# Patient Record
Sex: Male | Born: 1960 | Race: White | Hispanic: No | Marital: Married | Smoking: Never smoker
Health system: Southern US, Community
[De-identification: ages and names within clinical notes are randomized; demographics above are authoritative.]

## PROBLEM LIST (undated history)

## (undated) DIAGNOSIS — K219 Gastro-esophageal reflux disease without esophagitis: Secondary | ICD-10-CM

## (undated) DIAGNOSIS — I10 Essential (primary) hypertension: Secondary | ICD-10-CM

## (undated) DIAGNOSIS — T7840XA Allergy, unspecified, initial encounter: Secondary | ICD-10-CM

## (undated) DIAGNOSIS — F32A Depression, unspecified: Secondary | ICD-10-CM

## (undated) DIAGNOSIS — Z87442 Personal history of urinary calculi: Secondary | ICD-10-CM

## (undated) DIAGNOSIS — I639 Cerebral infarction, unspecified: Secondary | ICD-10-CM

## (undated) DIAGNOSIS — F329 Major depressive disorder, single episode, unspecified: Secondary | ICD-10-CM

## (undated) DIAGNOSIS — G4730 Sleep apnea, unspecified: Secondary | ICD-10-CM

## (undated) DIAGNOSIS — S53105A Unspecified dislocation of left ulnohumeral joint, initial encounter: Secondary | ICD-10-CM

## (undated) DIAGNOSIS — F419 Anxiety disorder, unspecified: Secondary | ICD-10-CM

## (undated) DIAGNOSIS — R112 Nausea with vomiting, unspecified: Secondary | ICD-10-CM

## (undated) DIAGNOSIS — Z9889 Other specified postprocedural states: Secondary | ICD-10-CM

## (undated) DIAGNOSIS — G459 Transient cerebral ischemic attack, unspecified: Secondary | ICD-10-CM

## (undated) HISTORY — DX: Sleep apnea, unspecified: G47.30

## (undated) HISTORY — DX: Essential (primary) hypertension: I10

## (undated) HISTORY — DX: Major depressive disorder, single episode, unspecified: F32.9

## (undated) HISTORY — DX: Allergy, unspecified, initial encounter: T78.40XA

---

## 2005-06-04 HISTORY — PX: LAPAROSCOPIC CHOLECYSTECTOMY: SUR755

## 2005-07-17 ENCOUNTER — Encounter: Admission: RE | Admit: 2005-07-17 | Discharge: 2005-07-17 | Payer: Self-pay | Admitting: Family Medicine

## 2005-07-18 ENCOUNTER — Encounter: Admission: RE | Admit: 2005-07-18 | Discharge: 2005-07-18 | Payer: Self-pay | Admitting: Family Medicine

## 2005-11-09 ENCOUNTER — Encounter (INDEPENDENT_AMBULATORY_CARE_PROVIDER_SITE_OTHER): Payer: Self-pay | Admitting: Specialist

## 2005-11-09 ENCOUNTER — Ambulatory Visit (HOSPITAL_COMMUNITY): Admission: RE | Admit: 2005-11-09 | Discharge: 2005-11-09 | Payer: Self-pay | Admitting: Surgery

## 2006-04-15 ENCOUNTER — Ambulatory Visit: Payer: Self-pay | Admitting: Family Medicine

## 2006-10-16 ENCOUNTER — Ambulatory Visit: Payer: Self-pay | Admitting: Family Medicine

## 2006-10-21 ENCOUNTER — Ambulatory Visit: Payer: Self-pay | Admitting: Family Medicine

## 2007-01-01 IMAGING — RF DG CHOLANGIOGRAM OPERATIVE
1 series · 4 of 4 positions shown · non-contrast
Comparison: none

CLINICAL DATA: Chronic cholecystitis.  Laparoscopic cholecystectomy.  
 INTRAOPERATIVE CHOLANGIOGRAM:
 The common bile duct is normal in caliber without retained calculi.  There is normal drainage into the duodenum and no extravasation.  Cystic duct remnant appears unremarkable.

[Series 1: run · 4 of 72 frames shown]
[frame 11/72]
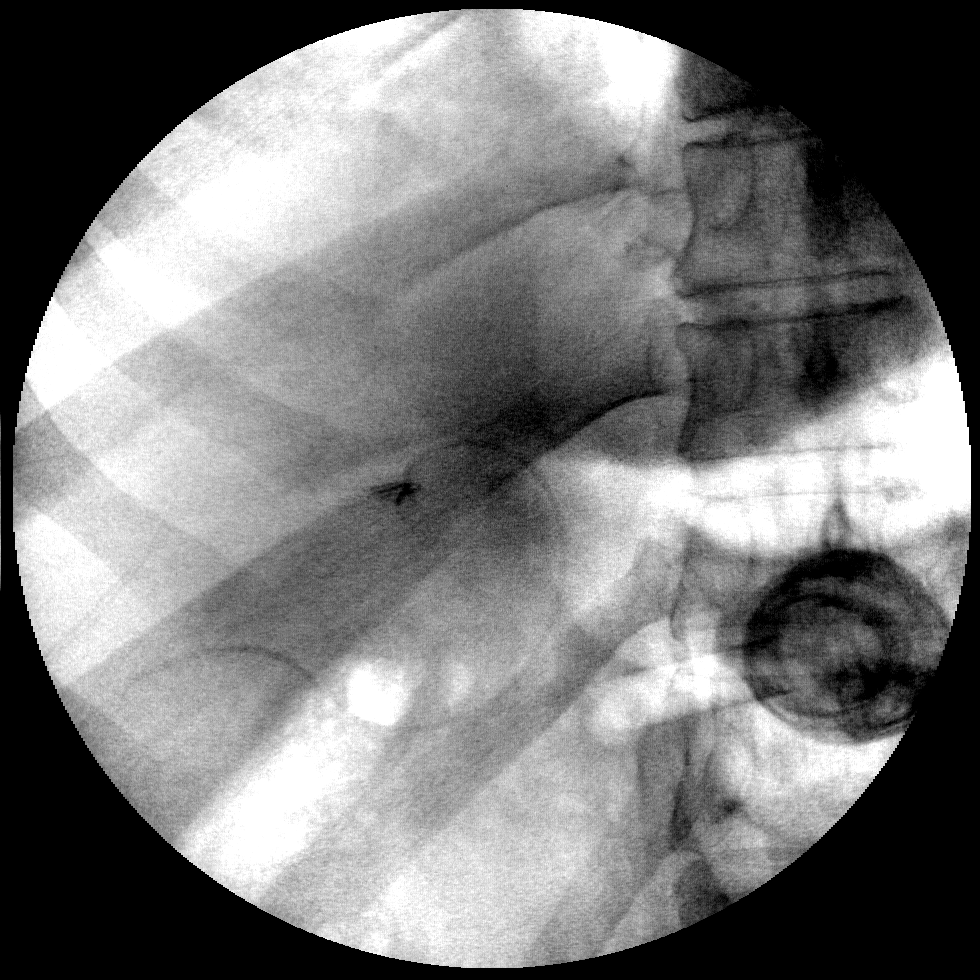
[frame 13/72]
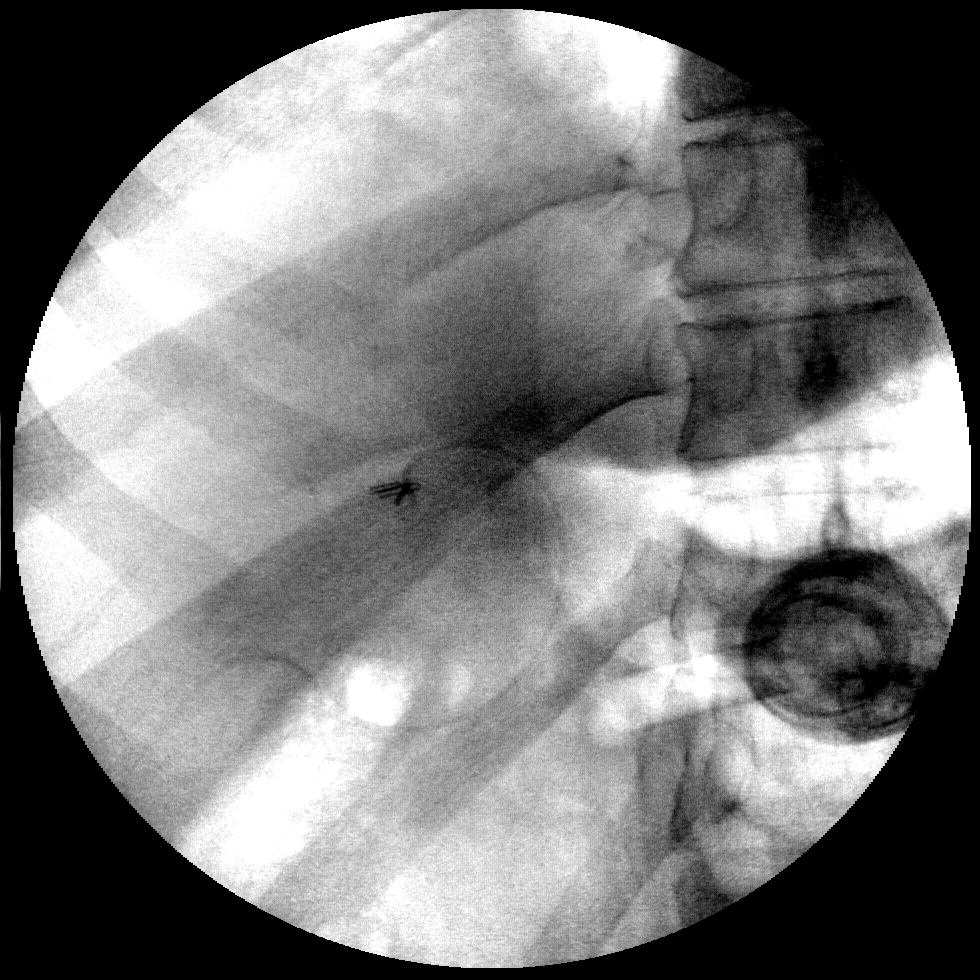
[frame 37/72]
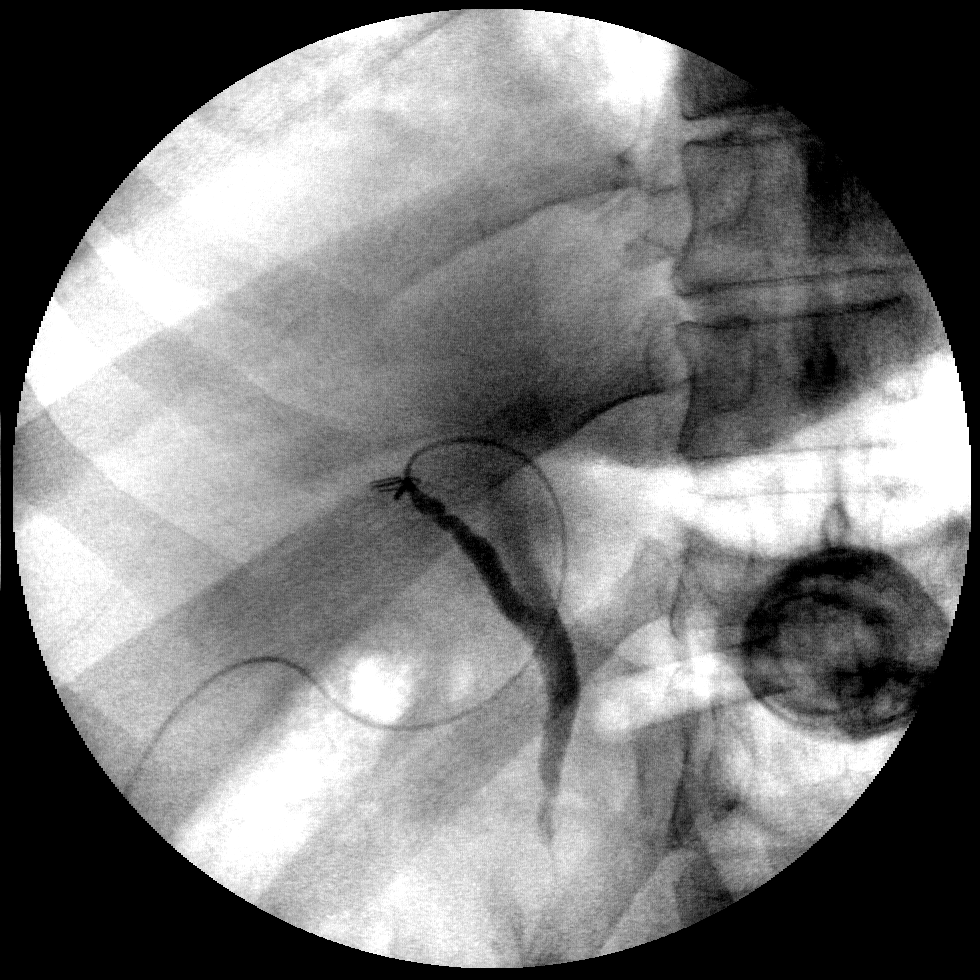
[frame 62/72]
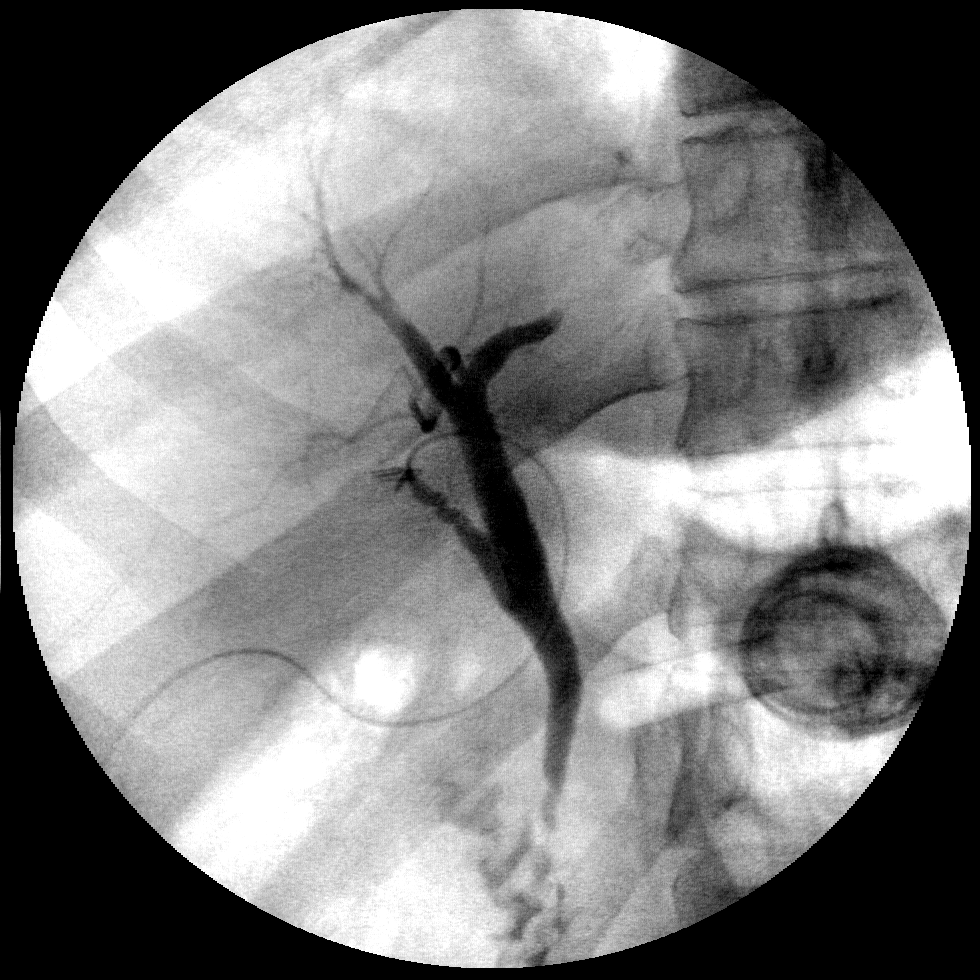

[4 of 4 positions shown; findings below may reference images not displayed]

IMPRESSION: Negative intraoperative cholangiogram.

## 2007-11-05 ENCOUNTER — Ambulatory Visit: Payer: Self-pay | Admitting: Family Medicine

## 2007-11-14 ENCOUNTER — Ambulatory Visit: Payer: Self-pay | Admitting: Family Medicine

## 2007-12-25 ENCOUNTER — Ambulatory Visit: Payer: Self-pay | Admitting: Family Medicine

## 2008-04-05 ENCOUNTER — Ambulatory Visit: Payer: Self-pay | Admitting: Family Medicine

## 2008-04-19 ENCOUNTER — Ambulatory Visit: Payer: Self-pay | Admitting: Family Medicine

## 2008-05-03 ENCOUNTER — Ambulatory Visit: Payer: Self-pay | Admitting: Family Medicine

## 2009-06-23 ENCOUNTER — Ambulatory Visit: Payer: Self-pay | Admitting: Family Medicine

## 2010-06-25 ENCOUNTER — Encounter: Payer: Self-pay | Admitting: Family Medicine

## 2010-10-09 ENCOUNTER — Ambulatory Visit (INDEPENDENT_AMBULATORY_CARE_PROVIDER_SITE_OTHER): Payer: BC Managed Care – PPO | Admitting: Family Medicine

## 2010-10-09 DIAGNOSIS — J0100 Acute maxillary sinusitis, unspecified: Secondary | ICD-10-CM

## 2010-10-20 NOTE — Op Note (Signed)
NAME:  Enck, Quasean                  ACCOUNT NO.:  [id number]   MEDICAL RECORD NO.:  [id number]          PATIENT TYPE:  AMB   LOCATION:  DAY                          FACILITY:  [facility]   PHYSICIAN:  Wilmon Arms. Corliss Skains, M.D. DATE OF BIRTH:  [birth year]   DATE OF PROCEDURE:  11/09/2005  DATE OF DISCHARGE:  11/10/2005                                 OPERATIVE REPORT   PREOPERATIVE DIAGNOSIS:  Chronic calculus cholecystitis.   POSTOPERATIVE DIAGNOSIS:  Chronic calculus cholecystitis.   PROCEDURE PERFORMED:  Laparoscopic cholecystectomy with intraoperative  cholangiogram.   SURGEON:  Wilmon Arms. Corliss Skains, M.D.   ASSISTANTSheppard Plumber. Earlene Plater, M.D.   ANESTHESIA:  General endotracheal.   INDICATIONS:  The patient is a 50 year old male who presents with a one-year  history of intermittent nausea and diarrhea.  He has also had several severe  episodes of abdominal pain associated with bloating, nausea, vomiting and  diarrhea.  An ultrasound performed showed multiple intraluminal gallstones  but no gallbladder wall thickening and normal bile duct size.  The patient  presents for cholecystectomy.   DESCRIPTION OF PROCEDURE:  The patient was brought to the operating room and  placed in supine position on the operating room table.  After an adequate  level of general endotracheal anesthesia was obtained, the patient's abdomen  was shaved, prepped with Betadine and draped in sterile fashion.  A time-out  was taken assure the proper patient, proper procedure.  A small amount of  local anesthetic was used to anesthetize the area just below his umbilicus.  Transverse incision was made, and dissection was carried down to the fascia.  Fascia was opened vertically.  The peritoneal cavity was bluntly entered.  A  stay suture of 0 Vicryl was placed in a pursestring fashion around the  fascial opening.  The Hasson cannula was inserted and secured with a stay  suture.  Pneumoperitoneum was obtained by  insufflating CO2, maintaining  maximal pressure of 15 mmHg.  The fascia was rotated in reverse  Trendelenburg position, rotated slightly to his left.  The scope was  inserted and the liver appeared normal.  The gallbladder had some flimsy  adhesions to it.  A 10-mm port was placed in subxiphoid position and two 5-  mm ports in the right upper quadrant.  The gallbladder was grasped with a  clamp and elevated over the edge of the liver.  The peritoneum was opened  over the hilum of the gallbladder, and the cystic duct was circumferentially  dissected.  It was ligated with a clip distally.  A small opening was  created on the cystic duct, and the [facility] cholangiogram catheter was inserted  through a stab incision and threaded into the cystic duct.  Initially, we  had some trouble threading the cholangiogram catheter, but eventually, we  were able to thread it around one of the spiral valves, and it was secured  with a clip.  The cholangiogram was obtained which showed a good flow  proximally and distally in the biliary tree with no filling defects.  There  was good flow  into the duodenum.  The catheter was then removed, and the  cystic duct was ligated with clips and divided.  The two branches of the  cystic artery were identified, and these were ligated with clips and  divided.  Cautery was then used to remove the gallbladder from the liver  bed.  The gallbladder was placed in an EndoCatch sac and removed through the  umbilical port.  The right upper quadrant was then reinspected for  hemostasis.  The irrigant was suctioned out.  The trocars were all removed.  The pursestring  suture was used to close the umbilical fascia.  Four-0 Monocryl was used to  close skin in a subcuticular fashion.  Steri-Strips and clean dressings were  applied.  The patient was then extubated and brought to recovery room in  stable condition.  All sponge and instrument counts were correct.      Wilmon Arms. Tsuei,  M.D.  Electronically Signed     MKT/MEDQ  D:  11/09/2005  T:  11/10/2005  Job:  [id number]   cc:   Sharlot Gowda, M.D.  Fax: [phone]

## 2010-10-23 ENCOUNTER — Encounter: Payer: Self-pay | Admitting: Family Medicine

## 2010-10-23 ENCOUNTER — Ambulatory Visit (INDEPENDENT_AMBULATORY_CARE_PROVIDER_SITE_OTHER): Payer: BC Managed Care – PPO | Admitting: Family Medicine

## 2010-10-23 VITALS — BP 140/90 | Temp 97.7°F | Wt 264.0 lb

## 2010-10-23 DIAGNOSIS — J019 Acute sinusitis, unspecified: Secondary | ICD-10-CM

## 2010-10-23 MED ORDER — AMOXICILLIN-POT CLAVULANATE 875-125 MG PO TABS: 1.0000 | ORAL_TABLET | Freq: Two times a day (BID) | ORAL | Status: AC

## 2010-10-23 NOTE — Patient Instructions (Signed)
Take all the Augmentin. If not entirely better please call for a refill or if  no improvement at all come back in for a visit. Use NyQuil at night for coughing

## 2010-10-23 NOTE — Progress Notes (Signed)
  Subjective:    Patient ID: Phillip Ortega, male    DOB: 1960/08/28, 50 y.o.   MRN: [id number]  HPI he was seen approximately 2 weeks ago and treated for sinus infection with amoxicillin. He states that he got almost 50% better area and he is now having difficulty with chest congestion and an intermittently productive cough as well as  congestion and slight sore throat but no fever or chills. He does have springtime allergies but usually it is over by this time. He does not smoke    Review of Systems     Objective:   Physical Exam TMs mucosa normal. Heart and lung exam normal. are clear. Throat is clear. Neck is supple without adenopathy.       Assessment & Plan:  Unresolved sinusitis. Obesity I discussed diet and exercise with him however he was reluctant to discuss this any further. Switch to Augmentin. Call if not entirely better.

## 2010-10-31 ENCOUNTER — Encounter: Payer: Self-pay | Admitting: Family Medicine

## 2010-10-31 ENCOUNTER — Ambulatory Visit (INDEPENDENT_AMBULATORY_CARE_PROVIDER_SITE_OTHER): Payer: BC Managed Care – PPO | Admitting: Family Medicine

## 2010-10-31 VITALS — BP 120/80 | Temp 97.8°F | Wt 260.0 lb

## 2010-10-31 DIAGNOSIS — J329 Chronic sinusitis, unspecified: Secondary | ICD-10-CM

## 2010-10-31 DIAGNOSIS — H6692 Otitis media, unspecified, left ear: Secondary | ICD-10-CM

## 2010-10-31 DIAGNOSIS — H6690 Otitis media, unspecified, unspecified ear: Secondary | ICD-10-CM

## 2010-10-31 MED ORDER — LEVOFLOXACIN 500 MG PO TABS: 500.0000 mg | ORAL_TABLET | Freq: Every day | ORAL | Status: AC

## 2010-10-31 NOTE — Progress Notes (Signed)
  Subjective:    Patient ID: Phillip Ortega, male    DOB: 03-01-1961, 50 y.o.   MRN: [id number]  HPI he is here for recheck. On his last visit he was treated for unresolved sinusitis. He continues to complain of nasal congestion and more a left-sided ear pain and pressure. No fever or chills but definite myalgias and malaise. He is been on Augmentin for one week and feels no improvement. He does not smoke but does have springtime allergy and they're usually gone by now.    Review of Systems     Objective:   Physical Exam alert and fatigued appearing. Left TM does show slight erythema and bulging, right is normal. Nasal mucosa is red and swollen with tenderness over frontal and maxillary sinuses. Throat is clear. Neck is supple without adenopathy. Heart and lungs normal.        Assessment & Plan:  Unresolved sinusitis with left otitis media I will switch him to Levaquin area discussed steroids with him and he would like to wait to see how with the medicine works before adding steroids

## 2010-10-31 NOTE — Patient Instructions (Signed)
Stay on the Levaquin on a daily basis. Use Tylenol or aspirin for aches and pains. Call me in 2 weeks and let me know how you do

## 2010-11-06 ENCOUNTER — Telehealth: Payer: Self-pay | Admitting: Family Medicine

## 2010-11-06 NOTE — Telephone Encounter (Signed)
I discussed his care. He is essentially no better. Give him another 5 days of Levaquin and a Sterapred double strength Dosepak for 12 days

## 2010-11-06 NOTE — Telephone Encounter (Signed)
Wife called husband not any better, throat sore, ear stopped up worse and is on 3rd round of antibiotic.  Please call Sue Lush [phone] or [phone].

## 2010-11-20 ENCOUNTER — Encounter: Payer: Self-pay | Admitting: Family Medicine

## 2010-11-20 ENCOUNTER — Ambulatory Visit (INDEPENDENT_AMBULATORY_CARE_PROVIDER_SITE_OTHER): Payer: BC Managed Care – PPO | Admitting: Family Medicine

## 2010-11-20 VITALS — BP 134/84 | HR 72 | Temp 98.5°F | Ht 73.0 in | Wt 262.0 lb

## 2010-11-20 DIAGNOSIS — J309 Allergic rhinitis, unspecified: Secondary | ICD-10-CM

## 2010-11-20 DIAGNOSIS — B370 Candidal stomatitis: Secondary | ICD-10-CM

## 2010-11-20 DIAGNOSIS — H9190 Unspecified hearing loss, unspecified ear: Secondary | ICD-10-CM

## 2010-11-20 MED ORDER — FLUTICASONE PROPIONATE 50 MCG/ACT NA SUSP: 2.0000 | Freq: Every day | NASAL | Status: DC

## 2010-11-20 MED ORDER — NYSTATIN 100000 UNIT/ML MT SUSP: 500000.0000 [IU] | Freq: Four times a day (QID) | OROMUCOSAL | Status: AC

## 2010-11-20 NOTE — Patient Instructions (Signed)
Restart Claritin and Mucinex

## 2010-11-20 NOTE — Progress Notes (Signed)
  Subjective:    Patient ID: Phillip Ortega, male    DOB: 21-May-1961, 50 y.o.   MRN: [id number]  HPI Patient presents with complaint of bilateral ear pain and sore throat.  He recently was treated for sinusitis and left otitis media  (initially with amoxacillin, then Augmentin, subsequently changed to Levaquin and 12 day prednisone pack).  Finished the prednisone 2 days ago.  Complains of ongoing stopped-up ears.  Has mainly been left ear, but has spread to right ear (plugging), and some pain in both ears (L worse than R) over the last 24 hours, and now tongue and throat hurts.  Feels feverish, but temps normal at home.  Not taking any OTC meds.  Had been taking ibuprofen and mucinex, but stopped these.   Review of Systems Denies nausea, vomiting, skin rashes.  Cough and congestion is much improved.  Slight chest congestion started today, some yellow phlegm (much less often than recently)   Objective:   Physical Exam BP 134/84  Pulse 72  Temp(Src) 98.5 F (36.9 C) (Oral)  Ht 6\' 1"  (1.854 m)  Wt 262 lb (118.842 kg)  BMI 34.57 kg/m2 Well developed male, in no distress.  Lethargic (was sleeping upon my arrival, but then became fully alert) HEENT:  PERRL, EOMI, conjunctiva clear.  TM's and EAC's without erythema or significant effusions.  Nasal mucosa mildly edematous, no erythema or purulence.  OP--tongue has white coating.  Upper palate has small areas of erythema, no ulcerations.  Posterior OP and uvula have some erythema and white patches.  Mucus membranes are moist.  Mild tenderness over both maxillary sinuses Neck:  No lymphadenopathy Heart:  Regular rate and rhythm without murmurs Lungs:  Clear bilaterally       Assessment & Plan:   1. Allergic rhinitis, cause unspecified  fluticasone (FLONASE) 50 MCG/ACT nasal spray   Restart Claritin, and start nasal steroid spray  2. Hearing loss  Ambulatory referral to ENT   suspect component of ETD, and h/o recent infection.  Symptoms not  improving with treatment--refer to ENT  3. Thrush, oral  nystatin (MYCOSTATIN) 100000 UNIT/ML suspension   Restart antihistamine (Claritin), Mucinex. Sinus rinses. Flonase--instructed on proper use Thrush--Nystatin  Refer to ENT for ongoing hearing/ear problems

## 2011-01-24 ENCOUNTER — Ambulatory Visit (HOSPITAL_COMMUNITY)
Admission: RE | Admit: 2011-01-24 | Discharge: 2011-01-24 | Disposition: A | Discharge status: Home / Self Care | Payer: BC Managed Care – PPO | Attending: Psychiatry | Admitting: Psychiatry

## 2011-01-24 DIAGNOSIS — F3289 Other specified depressive episodes: Secondary | ICD-10-CM | POA: Insufficient documentation

## 2011-01-24 DIAGNOSIS — F329 Major depressive disorder, single episode, unspecified: Secondary | ICD-10-CM | POA: Insufficient documentation

## 2011-02-15 ENCOUNTER — Ambulatory Visit (HOSPITAL_COMMUNITY): Payer: BC Managed Care – PPO | Admitting: Physician Assistant

## 2011-02-15 DIAGNOSIS — F341 Dysthymic disorder: Secondary | ICD-10-CM

## 2011-03-08 ENCOUNTER — Encounter (HOSPITAL_COMMUNITY): Payer: BC Managed Care – PPO | Admitting: Physician Assistant

## 2011-03-13 ENCOUNTER — Encounter (HOSPITAL_COMMUNITY): Payer: BC Managed Care – PPO | Admitting: Physician Assistant

## 2011-03-13 DIAGNOSIS — F341 Dysthymic disorder: Secondary | ICD-10-CM

## 2011-04-07 ENCOUNTER — Other Ambulatory Visit (HOSPITAL_COMMUNITY): Payer: Self-pay

## 2011-04-09 ENCOUNTER — Ambulatory Visit (HOSPITAL_COMMUNITY): Payer: BC Managed Care – PPO | Admitting: Physician Assistant

## 2011-04-09 ENCOUNTER — Encounter: Payer: Self-pay | Admitting: Family Medicine

## 2011-04-09 ENCOUNTER — Encounter (HOSPITAL_COMMUNITY): Payer: Self-pay | Admitting: Physician Assistant

## 2011-04-09 ENCOUNTER — Ambulatory Visit (INDEPENDENT_AMBULATORY_CARE_PROVIDER_SITE_OTHER): Payer: BC Managed Care – PPO | Admitting: Family Medicine

## 2011-04-09 VITALS — BP 150/98 | HR 78 | Wt 264.0 lb

## 2011-04-09 VITALS — BP 160/128 | HR 75 | Temp 97.9°F | Resp 16 | Ht 73.0 in | Wt 269.0 lb

## 2011-04-09 DIAGNOSIS — F3289 Other specified depressive episodes: Secondary | ICD-10-CM

## 2011-04-09 DIAGNOSIS — F411 Generalized anxiety disorder: Secondary | ICD-10-CM

## 2011-04-09 DIAGNOSIS — I10 Essential (primary) hypertension: Secondary | ICD-10-CM

## 2011-04-09 DIAGNOSIS — F329 Major depressive disorder, single episode, unspecified: Secondary | ICD-10-CM

## 2011-04-09 DIAGNOSIS — F419 Anxiety disorder, unspecified: Secondary | ICD-10-CM | POA: Insufficient documentation

## 2011-04-09 DIAGNOSIS — F341 Dysthymic disorder: Secondary | ICD-10-CM

## 2011-04-09 MED ORDER — BUSPIRONE HCL 15 MG PO TABS: ORAL_TABLET | ORAL | Status: DC

## 2011-04-09 MED ORDER — LISINOPRIL-HYDROCHLOROTHIAZIDE 10-12.5 MG PO TABS: 1.0000 | ORAL_TABLET | Freq: Every day | ORAL | Status: DC

## 2011-04-09 MED ORDER — BUPROPION HCL 100 MG PO TABS: 100.0000 mg | ORAL_TABLET | ORAL | Status: DC

## 2011-04-09 NOTE — Patient Instructions (Signed)
  Place anxiety patient instructions here. Place depression patient instructions here. Due to the new onset of primary hypertension he is referred to see his PCP Dr. Sharlot Gowda who will see him today at noon for evaluation. He will follow up with me in 1-2 weeks.

## 2011-04-09 NOTE — Progress Notes (Signed)
  Subjective:    Subjective:    Subjective:   Phillip Ortega is an 50 y.o. male who presents for evaluation and treatment of depressive symptoms.  Onset approximately 8 months ago, unchanged since that time.  Current symptoms include depressed mood, anhedonia, insomnia, psychomotor agitation, fatigue, feelings of worthlessness/guilt, difficulty concentrating, hopelessness, impaired memory, anxiety, loss of energy/fatigue and disturbed sleep.  Current treatment for depression:Individual therapy Sleep problems: Mild   Early awakening:Absent   Energy: Poor Motivation: Poor Concentration: Fair Rumination/worrying: Marked Memory: Fair Tearfulness: Absent  Anxiety: Marked  Panic: Absent  Overall Mood: Minimally improved  Hopelessness: Mild Suicidal ideation: Absent  Other/Psychosocial Stressors: Pt. Still is being frozen out of social contact with his all male co-workers. Family history positive for depression in the patient's unknown.  Previous treatment modalities employed include None.  Past episodes of depression:Previously in college. Organic causes of depression present: None.  Review of Systems Cardiovascular: negative   Objective:   Mental Status Examination: Posture and motor behavior: Appropriate Dress, grooming, personal hygiene: Appropriate Facial expression: Appropriate Speech: Appropriate Mood: Appropriate Coherency and relevance of thought: Appropriate Thought content: Appropriate Perceptions: Appropriate Orientation:Appropriate Attention and concentration: Appropriate Memory: : Appropriate Information: Not examined Vocabulary: Appropriate Abstract reasoning: Appropriate Judgment: Appropriate    Assessment:   Experiencing the following symptoms of depression most of the day nearly every day for more than two consecutive weeks: depressed mood  Depressive Disorder: with new onset of hypertension  Suicide Risk Assessment:  Suicidal intent: 0 Suicidal  plan: 0 Access to means for suicide: 0 Lethality of means for suicide: 0 Prior suicide attempts: 0 Recent exposure to suicide:0   Plan:    1. Depression  buPROPion (WELLBUTRIN) 100 MG tablet  2. Hypertension    3. Anxiety  busPIRone (BUSPAR) 15 MG tablet  4. Dysthymic disorder  busPIRone (BUSPAR) 15 MG tablet  5. Generalized anxiety disorder  busPIRone (BUSPAR) 15 MG tablet    Reviewed concept of depression as biochemical imbalance of neurotransmitters and rationale for treatment. Instructed patient to contact office or on-call physician promptly should condition worsen or any new symptoms appear and provided on-call telephone numbers.

## 2011-04-09 NOTE — Patient Instructions (Signed)
Work on [employer] and exercise. Take the medication and if you have difficulty with a cough, call me.

## 2011-04-09 NOTE — Progress Notes (Signed)
  Subjective:    Patient ID: Phillip Ortega, male    DOB: [birth year], 50 y.o.   MRN: [id number]  HPI He was sent here after visiting his psychologist. Apparently the blood pressures through her office were elevated over the last month. Review his record indicates he has had borderline blood pressure in the past.   Review of Systems     Objective:   Physical Exam Alert and in no distress. Blood pressure is recorded.       Assessment & Plan:  Hypertension I will place him on lisinopril with HCTZ. Discussed possible side effects. Also discussed lifestyle modification in terms of diet and exercise as well as avoiding caffeine and decongestants. Recheck here one month.

## 2011-04-12 ENCOUNTER — Encounter (HOSPITAL_COMMUNITY): Payer: BC Managed Care – PPO | Admitting: Physician Assistant

## 2011-04-23 ENCOUNTER — Other Ambulatory Visit (HOSPITAL_COMMUNITY): Payer: Self-pay

## 2011-04-24 ENCOUNTER — Encounter (HOSPITAL_COMMUNITY): Payer: Self-pay | Admitting: Physician Assistant

## 2011-04-24 ENCOUNTER — Ambulatory Visit (HOSPITAL_COMMUNITY): Payer: BC Managed Care – PPO | Admitting: Physician Assistant

## 2011-04-24 DIAGNOSIS — F329 Major depressive disorder, single episode, unspecified: Secondary | ICD-10-CM

## 2011-04-24 DIAGNOSIS — F411 Generalized anxiety disorder: Secondary | ICD-10-CM

## 2011-04-24 DIAGNOSIS — F341 Dysthymic disorder: Secondary | ICD-10-CM

## 2011-04-24 DIAGNOSIS — F419 Anxiety disorder, unspecified: Secondary | ICD-10-CM

## 2011-04-24 MED ORDER — BUPROPION HCL 100 MG PO TABS: 100.0000 mg | ORAL_TABLET | ORAL | Status: DC

## 2011-04-24 MED ORDER — FLUOXETINE HCL 20 MG PO TABS: 20.0000 mg | ORAL_TABLET | Freq: Every day | ORAL | Status: DC

## 2011-04-24 MED ORDER — BUSPIRONE HCL 15 MG PO TABS: ORAL_TABLET | ORAL | Status: DC

## 2011-04-24 NOTE — Patient Instructions (Signed)
Start Weight Watchers, continue low salt, low fat diet. Continue out patient therapy. Follow up 6 weeks.

## 2011-04-24 NOTE — Progress Notes (Signed)
  [facility] [address] Progress Note  Phillip Ortega [id number] 50 y.o.  04/24/2011 2:39 PM  Chief Complaint: Med Management  History of Present Illness: Patient was seen recently in this office and was noted to have elevated blood pressure.  He was sent to his PCP for evaluation and started on an ACEI Zestoretic.  He still notes having some headaches every other day or so, for which he takes Ibuprofen. Suicidal Ideation: No Plan Formed: No Patient has means to carry out plan: No  Homicidal Ideation: No Plan Formed: No Patient has means to carry out plan: No  Review of Systems: Psychiatric: Agitation: Yes Hallucination: No Depressed Mood: Yes Insomnia: No Hypersomnia: No Altered Concentration: Yes Feels Worthless: Yes Grandiose Ideas: No Belief In Special Powers: No New/Increased Substance Abuse: No Compulsions: No  Neurologic: Headache: No Seizure: No Paresthesias: No    Outpatient Encounter Prescriptions as of 04/24/2011  Medication Sig Dispense Refill  . buPROPion (WELLBUTRIN) 100 MG tablet Take 1 tablet (100 mg total) by mouth 1 day or 1 dose.  30 tablet  1  . busPIRone (BUSPAR) 15 MG tablet 1/2 tablet po BID  30 tablet  1  . FLUoxetine (PROZAC) 20 MG tablet Take 20 mg by mouth daily.        Marland Kitchen lisinopril-hydrochlorothiazide (ZESTORETIC) 10-12.5 MG per tablet Take 1 tablet by mouth daily.  30 tablet  11  . fluticasone (FLONASE) 50 MCG/ACT nasal spray Place 2 sprays into the nose daily.  16 g  5  . loratadine (CLARITIN) 10 MG tablet Take 10 mg by mouth daily.          Past Psychiatric History/Hospitalization(s): Anxiety: Yes Bipolar Disorder: No Depression: Yes Mania: No Psychosis: No Schizophrenia: No Personality Disorder: No Hospitalization for psychiatric illness: No History of Electroconvulsive Shock Therapy: No Prior Suicide Attempts: No  Physical Exam: Constitutional:  BP 110/77  Pulse 84  General Appearance: alert, oriented, no acute  distress  Musculoskeletal: Strength & Muscle Tone: within normal limits Gait & Station: normal Patient leans: N/A  Psychiatric: Speech (describe rate, volume, coherence, spontaneity, and abnormalities if any): rapid due to anxiety   Thought Process (describe rate, content, abstract reasoning, and computation):   Associations: Coherent  Thoughts: normal  Mental Status: Orientation: oriented to person, place, time/date and situation Mood & Affect: normal affect, anxiety and agitation Attention Span & Concentration: decreased  Medical Decision Making (Choose Three): Established Problem, Stable/Improving (1) Semaj was quickly assessed for symptoms of ADD as he noted several symptoms.  He does have 10 out of 16 symptoms. Assessment: Axis I: GAD            ADD  Axis II: N/A  Axis III: Hypertension, obesity  Axis IV: work stressors, social stressors  Axis V: GAF:60   Plan: Continue medication to see if he gets some resolution with the Welbutrin. I have also suggested that he start Weight Watchers to help with his weight loss. Continue out patient therapy. Follow up 6 weeks.  Braxton Weisbecker, PA 04/24/2011

## 2011-05-08 ENCOUNTER — Encounter: Payer: Self-pay | Admitting: Internal Medicine

## 2011-05-10 ENCOUNTER — Ambulatory Visit (INDEPENDENT_AMBULATORY_CARE_PROVIDER_SITE_OTHER): Payer: BC Managed Care – PPO | Admitting: Family Medicine

## 2011-05-10 ENCOUNTER — Encounter: Payer: Self-pay | Admitting: Family Medicine

## 2011-05-10 VITALS — BP 120/78 | HR 80 | Wt 264.0 lb

## 2011-05-10 DIAGNOSIS — I10 Essential (primary) hypertension: Secondary | ICD-10-CM

## 2011-05-10 NOTE — Progress Notes (Signed)
  Subjective:    Patient ID: Phillip Ortega, male    DOB: Nov 15, 1960, 50 y.o.   MRN: [id number]  HPI He is here for a blood pressure recheck. He is doing quite well. He does have some occasional aches and pains especially when he sits for House periods of time. He continues on his psychotropic medications and in counseling. He has had a flu shot.   Review of Systems     Objective:   Physical Exam Alert and in no distress with appropriate affect       Assessment & Plan:  Hypertension now adequately controlled. Continue on present medication regimen. Also encouraged him to be even more physically active on regular basis.

## 2011-05-10 NOTE — Patient Instructions (Signed)
Continue on your present medications. 

## 2011-06-05 DIAGNOSIS — G4730 Sleep apnea, unspecified: Secondary | ICD-10-CM

## 2011-06-05 DIAGNOSIS — F329 Major depressive disorder, single episode, unspecified: Secondary | ICD-10-CM

## 2011-06-05 HISTORY — DX: Sleep apnea, unspecified: G47.30

## 2011-06-05 HISTORY — DX: Major depressive disorder, single episode, unspecified: F32.9

## 2011-06-07 ENCOUNTER — Ambulatory Visit (HOSPITAL_COMMUNITY): Payer: BC Managed Care – PPO | Admitting: Physician Assistant

## 2011-07-02 ENCOUNTER — Encounter (HOSPITAL_COMMUNITY): Payer: Self-pay | Admitting: Psychiatry

## 2011-07-02 ENCOUNTER — Ambulatory Visit (INDEPENDENT_AMBULATORY_CARE_PROVIDER_SITE_OTHER): Payer: BC Managed Care – PPO | Admitting: Psychiatry

## 2011-07-02 DIAGNOSIS — F419 Anxiety disorder, unspecified: Secondary | ICD-10-CM

## 2011-07-02 DIAGNOSIS — F341 Dysthymic disorder: Secondary | ICD-10-CM

## 2011-07-02 DIAGNOSIS — F411 Generalized anxiety disorder: Secondary | ICD-10-CM

## 2011-07-02 MED ORDER — BUSPIRONE HCL 15 MG PO TABS: ORAL_TABLET | ORAL | Status: DC

## 2011-07-02 MED ORDER — FLUOXETINE HCL 20 MG PO TABS: 20.0000 mg | ORAL_TABLET | Freq: Every day | ORAL | Status: DC

## 2011-07-02 NOTE — Progress Notes (Signed)
[facility] [address] Progress Note  Phillip Ortega [id number] 51 y.o.  07/02/2011 3:58 PM  Chief Complaint:  I'm here to get my medication   History of Present Illness: Patient is 51 year old married Caucasian male who is seen in this office for med management. Patient is seen by this Clinical research associate first time. He was seen earlier by physician assistant in this office. Patient endorse history of anxiety depression and last year he has having some suicidal thoughts without any plan. He's been taking her antidepressant and 1 and anxiety medication. On last visit Wellbutrin was added to help his anxiety however he has noticed increase in his blood pressure since the Wellbutrin was added. He has not seen much improvement in his anxiety but reported that Prozac helps the depression. He wants to continue Prozac and BuSpar. His blood pressure is controlled with antihypertensive medication. He denies any shakes or headache recently.  Past psychiatric history Denies any history of inpatient psychiatric treatment or any previous suicidal attempt.  Social history Patient works in [employer]. He is married and has one daughter.  Suicidal Ideation: No Plan Formed: No Patient has means to carry out plan: No  Homicidal Ideation: No Plan Formed: No Patient has means to carry out plan: No  Review of Systems: Psychiatric: Agitation: Yes Hallucination: No Depressed Mood: Yes Insomnia: No Hypersomnia: No Altered Concentration: Yes Feels Worthless: Yes Grandiose Ideas: No Belief In Special Powers: No New/Increased Substance Abuse: No Compulsions: No  Neurologic: Headache: No Seizure: No Paresthesias: No    Outpatient Encounter Prescriptions as of 07/02/2011  Medication Sig Dispense Refill  . FLUoxetine (PROZAC) 20 MG tablet Take 1 tablet (20 mg total) by mouth daily.  30 tablet  0  . fluticasone (FLONASE) 50 MCG/ACT nasal spray Place 2 sprays into the nose daily.  16 g  5  .  DISCONTD: FLUoxetine (PROZAC) 20 MG tablet Take 1 tablet (20 mg total) by mouth daily.  30 tablet  1  . buPROPion (WELLBUTRIN) 100 MG tablet Take 1 tablet (100 mg total) by mouth 1 day or 1 dose.  30 tablet  1  . busPIRone (BUSPAR) 15 MG tablet 1/2 tablet po BID  30 tablet  0  . lisinopril-hydrochlorothiazide (ZESTORETIC) 10-12.5 MG per tablet Take 1 tablet by mouth daily.  30 tablet  11  . loratadine (CLARITIN) 10 MG tablet Take 10 mg by mouth daily.        Marland Kitchen DISCONTD: busPIRone (BUSPAR) 15 MG tablet 1/2 tablet po BID  30 tablet  1    Past Psychiatric History/Hospitalization(s): Anxiety: Yes Bipolar Disorder: No Depression: Yes Mania: No Psychosis: No Schizophrenia: No Personality Disorder: No Hospitalization for psychiatric illness: No History of Electroconvulsive Shock Therapy: No Prior Suicide Attempts: No  Physical Exam: Constitutional:  Wt 265 lb (120.203 kg)  General Appearance: alert, oriented, no acute distress  Musculoskeletal: Strength & Muscle Tone: within normal limits Gait & Station: normal Patient leans: N/A  Mental status examination Patient is casually dressed and fairly groomed. He appears very anxious and restless. His speech is very fast and rapid but clear and coherent. His thought process is also fast but logical and goal-directed. Denies any active or passive suicidal thoughts or homicidal thoughts. His attention and concentration is distracted at times. There no psychotic symptoms present. He's alert and oriented x3. There no delusion or paranoid thinking present. His insight judgment and impulse control is okay  Medical Decision Making (Choose Three): Established Problem, Stable/Improving (1) Herman was quickly  assessed for symptoms of ADD as he noted several symptoms.  He does have 10 out of 16 symptoms.  Assessment: Axis I: GAD            ADD  Axis II: N/A  Axis III: Hypertension, obesity  Axis IV: work stressors, social stressors  Axis V:  GAF:60   Plan: Continue  I do believe patient does not need to antidepressant especially if Wellbutrin is causing hypertensive episodes and headache. I will recommend to stop Wellbutrin and continue Prozac and BuSpar. I explained risks and benefits of medication in detail. Patient has lost some weight from the past and he is more involved in healthy diet and exercise. I recommended to call us if he has any question or concern about the medication or if he feel worsening of his depression and anxiety symptoms. I will see him again in 3 weeks. Time spent 30 minutes  Joshus Rogan T., MD 07/02/2011

## 2011-07-30 ENCOUNTER — Ambulatory Visit (HOSPITAL_COMMUNITY): Payer: BC Managed Care – PPO | Admitting: Psychiatry

## 2011-09-01 ENCOUNTER — Other Ambulatory Visit (HOSPITAL_COMMUNITY): Payer: Self-pay | Admitting: Psychiatry

## 2011-09-03 ENCOUNTER — Other Ambulatory Visit (HOSPITAL_COMMUNITY): Payer: Self-pay | Admitting: Psychiatry

## 2011-09-03 NOTE — Telephone Encounter (Signed)
Must see physician for future refills

## 2011-09-20 ENCOUNTER — Telehealth: Payer: Self-pay | Admitting: Internal Medicine

## 2011-09-20 MED ORDER — LISINOPRIL-HYDROCHLOROTHIAZIDE 10-12.5 MG PO TABS: 1.0000 | ORAL_TABLET | Freq: Every day | ORAL | Status: DC

## 2011-09-20 NOTE — Telephone Encounter (Signed)
Sent med in 

## 2011-10-29 ENCOUNTER — Other Ambulatory Visit (HOSPITAL_COMMUNITY): Payer: Self-pay | Admitting: Psychiatry

## 2011-10-30 ENCOUNTER — Other Ambulatory Visit (HOSPITAL_COMMUNITY): Payer: Self-pay | Admitting: Psychiatry

## 2012-07-31 ENCOUNTER — Encounter: Payer: Self-pay | Admitting: Family Medicine

## 2012-07-31 ENCOUNTER — Ambulatory Visit (INDEPENDENT_AMBULATORY_CARE_PROVIDER_SITE_OTHER): Payer: BC Managed Care – PPO | Admitting: Family Medicine

## 2012-07-31 VITALS — BP 140/90 | HR 78 | Temp 97.9°F | Wt 259.0 lb

## 2012-07-31 DIAGNOSIS — J302 Other seasonal allergic rhinitis: Secondary | ICD-10-CM | POA: Insufficient documentation

## 2012-07-31 DIAGNOSIS — J309 Allergic rhinitis, unspecified: Secondary | ICD-10-CM

## 2012-07-31 DIAGNOSIS — J019 Acute sinusitis, unspecified: Secondary | ICD-10-CM

## 2012-07-31 MED ORDER — AMOXICILLIN 875 MG PO TABS: 875.0000 mg | ORAL_TABLET | Freq: Two times a day (BID) | ORAL | Status: DC

## 2012-07-31 NOTE — Progress Notes (Signed)
  Subjective:    Patient ID: Phillip Ortega, male    DOB: [birth year], 52 y.o.   MRN: [id number]  HPI He has a one-week history of difficulty with sore throat, myalgias, dry cough is now productive. He then developed nasal congestion, headache, PND, sneezing, itchy watery eyes. No fever or chills but now he is having intermittent bilateral earache. He has tried OTC meds for relief. He does not smokeand does have seasonal allergies for which he takes Claritin.   Review of Systems     Objective:   Physical Exam alert and in no distress. Tympanic membranes and canals are normal. Throat is clear. Tonsils are normal. Neck is supple without adenopathy or thyromegaly. Cardiac exam shows a regular sinus rhythm without murmurs or gallops. Lungs are clear to auscultation. Nasal mucosa is red with tenderness over frontal sinuses        Assessment & Plan:  Acute sinusitis - Plan: amoxicillin (AMOXIL) 875 MG tablet  Allergic rhinitis, seasonal he is to call if not entirely better he finishes the antibiotic.

## 2012-07-31 NOTE — Patient Instructions (Signed)

## 2012-10-13 ENCOUNTER — Ambulatory Visit (INDEPENDENT_AMBULATORY_CARE_PROVIDER_SITE_OTHER): Payer: BC Managed Care – PPO | Admitting: Medical

## 2012-10-13 ENCOUNTER — Encounter: Payer: Self-pay | Admitting: Medical

## 2012-10-13 VITALS — BP 130/90 | HR 80 | Temp 98.1°F | Resp 16 | Wt 258.0 lb

## 2012-10-13 DIAGNOSIS — J309 Allergic rhinitis, unspecified: Secondary | ICD-10-CM

## 2012-10-13 MED ORDER — CETIRIZINE HCL 10 MG PO TABS: 10.0000 mg | ORAL_TABLET | Freq: Every day | ORAL | Status: DC

## 2012-10-13 MED ORDER — MOMETASONE FUROATE 50 MCG/ACT NA SUSP: 2.0000 | Freq: Every day | NASAL | Status: DC

## 2012-10-13 NOTE — Progress Notes (Signed)
Subjective:  Phillip Ortega is a 52 y.o. male who presents for possible sinus infection.    Here for 3 day hx/o sneezing, 2 day hx/o nasal congestion, sinus pressure, runny nose, post nasal drip, sore throat, dry cough, and body aches.  occasional itchy eyes.  Temp 99.2 yesterday. otherwise no fever, chills, SOB, NVD.  Past history is significant for seasonal allergies, using claritin daily. Using OTC cold medication and antihistamine.  No prior prescription allergy medication.   He has had sinus infections in the past, had one in January. Patient is a non-smoker.  Denies sick contacts.  No other aggravating or relieving factors.  No other c/o.  Past Medical History  Diagnosis Date  . Hypertension   . Allergy     rhinitis   ROS as in subjective  Objective: Filed Vitals:   10/13/12 1143  BP: 130/90  Pulse: 80  Temp: 98.1 F (36.7 C)  Resp: 16    General appearance: Alert, WD/WN, no distress                             Skin: warm, no rash                           Head: +mild frontal sinus tenderness,                            Eyes: conjunctiva normal, corneas clear, PERRLA                            Ears: pearly TMs, external ear canals normal                          Nose: swollen pink turbinates,with clear discharge             Mouth/throat: MMM, tongue normal, mild pharyngeal erythema                           Neck: supple, no adenopathy, no thyromegaly, nontender                          Heart: RRR, normal S1, S2, no murmurs                         Lungs: CTA bilaterally, no wheezes, rales, or rhonchi      Assessment and Plan:   Encounter Diagnosis  Name Primary?  . Allergic rhinitis Yes   Your current symptoms suggest congestion related to all the pollen  Recommendations:  Increase water intake  consider Zyrtec or Allegra OTC as a better antihistamine (sneezing, runny nose, congestion)  consider nasal saline - spray or flush, consider this twice daily  Follow this  up with salt water gargles to clear out mucous from the back of the throat  Begin Nasonex, 1 spray per nostril twice daily for 1-2 weeks.  After 1-2 weeks, if this is helping a lot, just go to once daily  F/u prn

## 2012-10-13 NOTE — Patient Instructions (Addendum)
Your current symptoms suggest congestion related to all the pollen  Recomedatinos:  Increase water intake  consdier Zyrtec or Allegra OTC as a better anthisihsamine (sneezing, runny nose, congestion)  coinsdier nasal saline - spray or flush, consider this twice daily  Follow this up with salt water gargles to clear out mucous from the back of the throat  Begin Nasonex, 1 spray per nostril twice daily for 1-2 weeks.  After 1-2 weeks, if this is helping a lot, just go to once daily (sniff sniff)  Allergic Rhinitis Allergic rhinitis is when the mucous membranes in the nose respond to allergens. Allergens are particles in the air that cause your body to have an allergic reaction. This causes you to release allergic antibodies. Through a chain of events, these eventually cause you to release histamine into the blood stream (hence the use of antihistamines). Although meant to be protective to the body, it is this release that causes your discomfort, such as frequent sneezing, congestion and an itchy runny nose.  CAUSES  The pollen allergens may come from grasses, trees, and weeds. This is seasonal allergic rhinitis, or "hay fever." Other allergens cause year-round allergic rhinitis (perennial allergic rhinitis) such as house dust mite allergen, pet dander and mold spores.  SYMPTOMS   Nasal stuffiness (congestion).  Runny, itchy nose with sneezing and tearing of the eyes.  There is often an itching of the mouth, eyes and ears. It cannot be cured, but it can be controlled with medications. DIAGNOSIS  If you are unable to determine the offending allergen, skin or blood testing may find it. TREATMENT   Avoid the allergen.  Medications and allergy shots (immunotherapy) can help.  Hay fever may often be treated with antihistamines in pill or nasal spray forms. Antihistamines block the effects of histamine. There are over-the-counter medicines that may help with nasal congestion and swelling  around the eyes. Check with your caregiver before taking or giving this medicine. If the treatment above does not work, there are many new medications your caregiver can prescribe. Stronger medications may be used if initial measures are ineffective. Desensitizing injections can be used if medications and avoidance fails. Desensitization is when a patient is given ongoing shots until the body becomes less sensitive to the allergen. Make sure you follow up with your caregiver if problems continue. SEEK MEDICAL CARE IF:   You develop fever (more than 100.5 F (38.1 C).  You develop a cough that does not stop easily (persistent).  You have shortness of breath.  You start wheezing.  Symptoms interfere with normal daily activities. Document Released: 02/13/2001 Document Revised: 08/13/2011 Document Reviewed: 08/25/2008 [facility] Patient Information 2013 [place], [place].

## 2014-06-28 ENCOUNTER — Ambulatory Visit (INDEPENDENT_AMBULATORY_CARE_PROVIDER_SITE_OTHER): Payer: BLUE CROSS/BLUE SHIELD | Admitting: Family Medicine

## 2014-06-28 VITALS — BP 146/100 | HR 84 | Temp 98.4°F | Wt 273.0 lb

## 2014-06-28 DIAGNOSIS — R030 Elevated blood-pressure reading, without diagnosis of hypertension: Secondary | ICD-10-CM

## 2014-06-28 DIAGNOSIS — E669 Obesity, unspecified: Secondary | ICD-10-CM

## 2014-06-28 DIAGNOSIS — J019 Acute sinusitis, unspecified: Secondary | ICD-10-CM

## 2014-06-28 DIAGNOSIS — IMO0001 Reserved for inherently not codable concepts without codable children: Secondary | ICD-10-CM

## 2014-06-28 MED ORDER — AMOXICILLIN 875 MG PO TABS: 875.0000 mg | ORAL_TABLET | Freq: Two times a day (BID) | ORAL | Status: DC

## 2014-06-28 NOTE — Progress Notes (Signed)
   Subjective:    Patient ID: Phillip Ortega, male    DOB: 1961-03-26, 54 y.o.   MRN: [id number]  HPI He is here for evaluation of a one-week history of generalized aching as well as sinus pain and nasal congestion, dry cough, chills. No PND, sore throat, earache, to discomfort. He does not smoke.   Review of Systems     Objective:   Physical Exam alert and in no distress. Tympanic membranes and canals are normal. Nasal mucosa is normal. Tender over frontal and maxillary sinuses. Throat is clear. Tonsils are normal. Neck is supple without adenopathy or thyromegaly. Cardiac exam shows a regular sinus rhythm without murmurs or gallops. Lungs are clear to auscultation.        Assessment & Plan:  Acute sinusitis, recurrence not specified, unspecified location - Plan: amoxicillin (AMOXIL) 875 MG tablet  Obesity  Elevated blood pressure  he is to call me if not entirely better and he finishes the antibiotic. Also discussed making last I'll changes in regard to diet and exercise. Recommended 15-20 minutes of something physical every day. Discussed breaking it up into 35 minute increments while at work. Also discussed cutting back on carbohydrates. He is to return here in several months for recheck on his blood pressure

## 2014-06-28 NOTE — Patient Instructions (Addendum)
Get 15-20 minutes of some kind of physical activity everyday. It can be 5 minutes here and there . Cut back on white food. Bread, rice, pasta, potatoes, sugar If you are not totally back to normal when you finish the antibiotic, call me.

## 2014-06-30 ENCOUNTER — Telehealth: Payer: Self-pay | Admitting: Family Medicine

## 2014-06-30 MED ORDER — BENZONATATE 200 MG PO CAPS: 200.0000 mg | ORAL_CAPSULE | Freq: Three times a day (TID) | ORAL | Status: DC | PRN

## 2014-06-30 NOTE — Telephone Encounter (Signed)
Pt called and stated that he is not any better. States he is now running a fever and developed a cough. Pt would like something for cough. [facility] uses [facility] [address] and can be reached at [phone].

## 2014-07-02 ENCOUNTER — Emergency Department (HOSPITAL_COMMUNITY): Payer: BLUE CROSS/BLUE SHIELD

## 2014-07-02 ENCOUNTER — Ambulatory Visit (INDEPENDENT_AMBULATORY_CARE_PROVIDER_SITE_OTHER): Payer: BLUE CROSS/BLUE SHIELD | Admitting: Medical

## 2014-07-02 ENCOUNTER — Emergency Department (HOSPITAL_COMMUNITY)
Admission: EM | Admit: 2014-07-02 | Discharge: 2014-07-02 | Disposition: A | Discharge status: Home / Self Care | Payer: BLUE CROSS/BLUE SHIELD | Attending: Emergency Medicine | Admitting: Emergency Medicine

## 2014-07-02 ENCOUNTER — Ambulatory Visit
Admission: RE | Admit: 2014-07-02 | Discharge: 2014-07-02 | Disposition: A | Discharge status: Home / Self Care | Payer: Self-pay | Source: Ambulatory Visit | Attending: Medical | Admitting: Medical

## 2014-07-02 ENCOUNTER — Telehealth: Payer: Self-pay | Admitting: Medical

## 2014-07-02 ENCOUNTER — Encounter (HOSPITAL_COMMUNITY): Payer: Self-pay | Admitting: Physical Medicine and Rehabilitation

## 2014-07-02 ENCOUNTER — Other Ambulatory Visit: Payer: Self-pay | Admitting: Medical

## 2014-07-02 ENCOUNTER — Encounter: Payer: Self-pay | Admitting: Medical

## 2014-07-02 VITALS — BP 92/60 | HR 86 | Temp 98.0°F | Resp 16 | Wt 273.0 lb

## 2014-07-02 DIAGNOSIS — I10 Essential (primary) hypertension: Secondary | ICD-10-CM | POA: Diagnosis not present

## 2014-07-02 DIAGNOSIS — R059 Cough, unspecified: Secondary | ICD-10-CM

## 2014-07-02 DIAGNOSIS — R062 Wheezing: Secondary | ICD-10-CM

## 2014-07-02 DIAGNOSIS — R918 Other nonspecific abnormal finding of lung field: Secondary | ICD-10-CM

## 2014-07-02 DIAGNOSIS — R509 Fever, unspecified: Secondary | ICD-10-CM

## 2014-07-02 DIAGNOSIS — J209 Acute bronchitis, unspecified: Secondary | ICD-10-CM | POA: Diagnosis not present

## 2014-07-02 DIAGNOSIS — R05 Cough: Secondary | ICD-10-CM | POA: Diagnosis present

## 2014-07-02 DIAGNOSIS — Z7951 Long term (current) use of inhaled steroids: Secondary | ICD-10-CM | POA: Insufficient documentation

## 2014-07-02 DIAGNOSIS — Z792 Long term (current) use of antibiotics: Secondary | ICD-10-CM | POA: Diagnosis not present

## 2014-07-02 DIAGNOSIS — I951 Orthostatic hypotension: Secondary | ICD-10-CM

## 2014-07-02 DIAGNOSIS — Z79899 Other long term (current) drug therapy: Secondary | ICD-10-CM | POA: Diagnosis not present

## 2014-07-02 LAB — CBC WITH DIFFERENTIAL/PLATELET
BASOS ABS: 0 10*3/uL (ref 0.0–0.1)
Eosinophils Absolute: 0 10*3/uL (ref 0.0–0.7)
HCT: 45 % (ref 39.0–52.0)
HEMOGLOBIN: 14.1 g/dL (ref 13.0–17.0)
LYMPHS ABS: 1.7 10*3/uL (ref 0.7–4.0)
Lymphocytes Relative: 19 % (ref 12–46)
MCH: 27.2 pg (ref 26.0–34.0)
MCHC: 31.2 g/dL (ref 30.0–36.0)
MCV: 87 fL (ref 78.0–100.0)
MONO ABS: 0.4 10*3/uL (ref 0.1–1.0)
Monocytes Relative: 4 % (ref 3–12)
Neutro Abs: 6.9 10*3/uL (ref 1.7–7.7)
Neutrophils Relative %: 77 % (ref 43–77)
Platelets: 201 10*3/uL (ref 150–400)
RBC: 5.18 MIL/uL (ref 4.22–5.81)
RDW: 13.8 % (ref 11.5–15.5)
WBC: 9 10*3/uL (ref 4.0–10.5)

## 2014-07-02 LAB — BASIC METABOLIC PANEL
BUN: 16 mg/dL (ref 6–23)
CHLORIDE: 100 meq/L (ref 96–112)
CO2: 28 meq/L (ref 19–32)
Calcium: 8.9 mg/dL (ref 8.4–10.5)
Creat: 1.2 mg/dL (ref 0.50–1.35)
GLUCOSE: 118 mg/dL — AB (ref 70–99)
POTASSIUM: 4.6 meq/L (ref 3.5–5.3)
Sodium: 139 mEq/L (ref 135–145)

## 2014-07-02 LAB — I-STAT CHEM 8, ED
BUN: 21 mg/dL (ref 6–23)
CHLORIDE: 97 mmol/L (ref 96–112)
Calcium, Ion: 1.09 mmol/L — ABNORMAL LOW (ref 1.12–1.23)
Creatinine, Ser: 1.3 mg/dL (ref 0.50–1.35)
GLUCOSE: 107 mg/dL — AB (ref 70–99)
HCT: 47 % (ref 39.0–52.0)
Hemoglobin: 16 g/dL (ref 13.0–17.0)
Potassium: 3.7 mmol/L (ref 3.5–5.1)
SODIUM: 135 mmol/L (ref 135–145)
TCO2: 24 mmol/L (ref 0–100)

## 2014-07-02 LAB — POC INFLUENZA A&B (BINAX/QUICKVUE)
INFLUENZA A, POC: NEGATIVE
INFLUENZA B, POC: NEGATIVE

## 2014-07-02 LAB — D-DIMER, QUANTITATIVE (NOT AT ARMC): D DIMER QUANT: 1.19 ug{FEU}/mL — AB (ref 0.00–0.48)

## 2014-07-02 LAB — BRAIN NATRIURETIC PEPTIDE: Brain Natriuretic Peptide: 11.1 pg/mL (ref 0.0–100.0)

## 2014-07-02 MED ORDER — ALBUTEROL SULFATE HFA 108 (90 BASE) MCG/ACT IN AERS
2.0000 | INHALATION_SPRAY | RESPIRATORY_TRACT | Status: DC | PRN
  Administered 2014-07-02: 2 via RESPIRATORY_TRACT
  Filled 2014-07-02: qty 6.7

## 2014-07-02 MED ORDER — AEROCHAMBER PLUS W/MASK MISC
1.0000 | Freq: Once | Status: DC
  Filled 2014-07-02: qty 1

## 2014-07-02 MED ORDER — IOHEXOL 350 MG/ML SOLN
100.0000 mL | Freq: Once | INTRAVENOUS | Status: AC | PRN
  Administered 2014-07-02: 100 mL via INTRAVENOUS

## 2014-07-02 NOTE — ED Notes (Signed)
Pt made aware to return if symptoms worsen or if any life threatening symptoms occur.   

## 2014-07-02 NOTE — ED Notes (Signed)
Pt presents to department for evaluation of cough and chest congestion. Ongoing for several days, was seen at PCP today, had chest x-ray, sent to ED for further follow up for abnormal findings. Respirations unlabored. Pt is alert and oriented x4.

## 2014-07-02 NOTE — Telephone Encounter (Signed)
Make sure he is on the way, if not have him call 911 for transport.   He needs EKG and other evaluation, IV fluids.

## 2014-07-02 NOTE — Telephone Encounter (Signed)
Pt informed abnormal X-ray & per Vincenza HewsShane to [facility].  He agreed and will have someone drive him

## 2014-07-02 NOTE — Progress Notes (Addendum)
Subjective: Here for illness, recheck.  Saw Dr. Susann GivensLalonde 4 days ago Monday.   He reports over 1.5 wk hx/o cough, chills, body aches,sore throat, hurts all over, woozy feeling, nausea, vomiting once, feels feverish.  Cough and chest congestion got worse Tuesday the day after being here.   Was prescribed amoxicillin and tessalon perles which he is taking .  Denies chest pain, edema, diarrhea, rash.  No sinus pressure.   Wife had bronchitis few weeks ago.  No other sick contacts.  No other aggravating or relieving factors. No other complaint.  ROS as in subjective  Objective BP 92/60 mmHg  Pulse 86  Temp(Src) 98 F (36.7 C) (Oral)  Resp 16  Wt 273 lb (123.832 kg)  SpO2 96%   Wt Readings from Last 3 Encounters:  07/02/14 273 lb (123.832 kg)  06/28/14 273 lb (123.832 kg)  10/13/12 258 lb (117.028 kg)    Gen: wd, wn, nad, somewhat lethargic Skin: dry HENT: no sinus tenderness, TMs with mild erythema, nares with turbinate edema, clear discharge, pharynx with erythema Mouth: MMM, no lesions Lungs: decrease breath sounds somewhat, +rhonchi, no frank dullness, no wheezing Heart: RRR, normal s1, s2, no murmurs Ext: no edema Neck: supple, nontender, no mass or thyromegaly    Assessment: Encounter Diagnoses  Name Primary?  . Fever and chills Yes  . Cough   . Wheezing   . Abnormal lung field   . Orthostatic hypotension     Plan: Flu test negative.  Will send for CXR, STAT labs.    Of note after I had left the room he states that he felt so bad he lied down in the floor.  I asked his friend who brought him here to come back to the room.  We discussed his symptoms, findings, we will send for chest x-ray, discussed his friend holding on to him to help ambulate here and at the radiology office to prevent him falling. We will call with lab results and plan.Marland Kitchen.  Pending labs may even need to consider sending to the hospital but will await labs and chest x-ray.  He was somewhat lethargic but  answered questions appropriately, clinically he is much worse then when he was seen here on Monday.  For now advised much more hydration, rest, advise his friend supervise him over the weekend. If worsening, not improving, go to the hospital  Addendum: Of note he was orthostatic in the office.  After seeing the chest x-ray result and labs we're more concerned now about a cardiac source of his symptoms, possibly congestive heart failure, ACS.  We called and had his friend take him to the emergency department.  They will take him now, [facility]  I called and spoke to Dr. Susann GivensLalonde his primary care provider here, and based on his presentation earlier in the week and now, much difference in his symptoms and clinical picture.  I called [facility] emergency department triage advised that he was on the way.  CXR, BMET, CBC in Epic.  Pending BNP which was added on earlier.

## 2014-07-02 NOTE — Addendum Note (Signed)
Addended by: Jac CanavanYSINGER, Pedram S on: 07/02/2014 01:39 PM   Modules accepted: Orders

## 2014-07-02 NOTE — Discharge Instructions (Signed)
Use the inhaler 2 puffs every 3-4 hours as needed for cough or trouble breathing, Drink plenty of fluids.   Acute Bronchitis Bronchitis is inflammation of the airways that extend from the windpipe into the lungs (bronchi). The inflammation often causes mucus to develop. This leads to a cough, which is the most common symptom of bronchitis.  In acute bronchitis, the condition usually develops suddenly and goes away over time, usually in a couple weeks. Smoking, allergies, and asthma can make bronchitis worse. Repeated episodes of bronchitis may cause further lung problems.  CAUSES Acute bronchitis is most often caused by the same virus that causes a cold. The virus can spread from person to person (contagious) through coughing, sneezing, and touching contaminated objects. SIGNS AND SYMPTOMS   Cough.   Fever.   Coughing up mucus.   Body aches.   Chest congestion.   Chills.   Shortness of breath.   Sore throat.  DIAGNOSIS  Acute bronchitis is usually diagnosed through a physical exam. Your health care provider will also ask you questions about your medical history. Tests, such as chest X-rays, are sometimes done to rule out other conditions.  TREATMENT  Acute bronchitis usually goes away in a couple weeks. Oftentimes, no medical treatment is necessary. Medicines are sometimes given for relief of fever or cough. Antibiotic medicines are usually not needed but may be prescribed in certain situations. In some cases, an inhaler may be recommended to help reduce shortness of breath and control the cough. A cool mist vaporizer may also be used to help thin bronchial secretions and make it easier to clear the chest.  HOME CARE INSTRUCTIONS  Get plenty of rest.   Drink enough fluids to keep your urine clear or pale yellow (unless you have a medical condition that requires fluid restriction). Increasing fluids may help thin your respiratory secretions (sputum) and reduce chest  congestion, and it will prevent dehydration.   Take medicines only as directed by your health care provider.  If you were prescribed an antibiotic medicine, finish it all even if you start to feel better.  Avoid smoking and secondhand smoke. Exposure to cigarette smoke or irritating chemicals will make bronchitis worse. If you are a smoker, consider using nicotine gum or skin patches to help control withdrawal symptoms. Quitting smoking will help your lungs heal faster.   Reduce the chances of another bout of acute bronchitis by washing your hands frequently, avoiding people with cold symptoms, and trying not to touch your hands to your mouth, nose, or eyes.   Keep all follow-up visits as directed by your health care provider.  SEEK MEDICAL CARE IF: Your symptoms do not improve after 1 week of treatment.  SEEK IMMEDIATE MEDICAL CARE IF:  You develop an increased fever or chills.   You have chest pain.   You have severe shortness of breath.  You have bloody sputum.   You develop dehydration.  You faint or repeatedly feel like you are going to pass out.  You develop repeated vomiting.  You develop a severe headache. MAKE SURE YOU:   Understand these instructions.  Will watch your condition.  Will get help right away if you are not doing well or get worse. Document Released: 06/28/2004 Document Revised: 10/05/2013 Document Reviewed: 11/11/2012 [facility] Patient Information 2015 [place]. This information is not intended to replace advice given to you by your health care provider. Make sure you discuss any questions you have with your health care provider.

## 2014-07-02 NOTE — ED Notes (Signed)
Pt reports sinus congestion since Tuesday accompanied by dry cough.  Pt denies SOB.  Pt also adds that he has had a right sided temporal headache x1 week.  Pt alert and oriented.

## 2014-07-02 NOTE — ED Provider Notes (Signed)
CSN: [id number]     Arrival date & time 07/02/14  1511 History   First MD Initiated Contact with Patient 07/02/14 1812     Chief Complaint  Patient presents with  . Cough     (Consider location/radiation/quality/duration/timing/severity/associated sxs/prior Treatment) HPI   Phillip Ortega is a 54 y.o. male who presents for evaluation of abnormal chest x-ray, sinus congestion, cough and weakness.  He has been ill for about 1 week.  His PCP started him on amoxicillin and Tessalon for the cough.  He had episode of feeling weak and had to lay down, while in his doctor's office.  He did not fall or have syncope.  He has never had any cardiac disease that he knows of.  He denies chest pain, back pain, focal weakness, paresthesia, nausea, vomiting or productive cough.  He denies leg pain or difficulty walking.  There are no other known modifying factors.   Past Medical History  Diagnosis Date  . Hypertension   . Allergy     rhinitis   Past Surgical History  Procedure Laterality Date  . Laparoscopic cholecystectomy  07    Tsuri   Family History  Problem Relation Age of Onset  . Anemia Sister    History  Substance Use Topics  . Smoking status: Never Smoker   . Smokeless tobacco: Never Used  . Alcohol Use: 0.0 oz/week    0 Not specified per week     Comment: social    Review of Systems  All other systems reviewed and are negative.     Allergies  Review of patient's allergies indicates no known allergies.  Home Medications   Prior to Admission medications   Medication Sig Start Date End Date Taking? Authorizing Provider  amoxicillin (AMOXIL) 875 MG tablet Take 1 tablet (875 mg total) by mouth 2 (two) times daily. 06/28/14   Ronnald Nian, MD  benzonatate (TESSALON) 200 MG capsule Take 1 capsule (200 mg total) by mouth 3 (three) times daily as needed for cough. 06/30/14   Ronnald Nian, MD  buPROPion (WELLBUTRIN) 100 MG tablet Take 1 tablet (100 mg total) by mouth 1 day or 1  dose. 05/19/11 06/28/14  Verne Spurr, PA-C  cetirizine (ZYRTEC) 10 MG tablet Take 1 tablet (10 mg total) by mouth daily. 10/13/12   Kermit Balo Tysinger, PA-C  FLUoxetine (PROZAC) 20 MG tablet TAKE 1 TABLET BY MOUTH EVERY DAY 09/01/11   Cleotis Nipper, MD  fluticasone (FLONASE) 50 MCG/ACT nasal spray Place 2 sprays into the nose daily. 11/20/10 06/28/14  Joselyn Arrow, MD  lisinopril-hydrochlorothiazide (ZESTORETIC) 10-12.5 MG per tablet Take 1 tablet by mouth daily. Patient not taking: Reported on 06/28/2014 09/20/11 09/19/12  Ronnald Nian, MD  mometasone (NASONEX) 50 MCG/ACT nasal spray Place 2 sprays into the nose daily. 10/13/12   Kermit Balo Tysinger, PA-C   BP 116/61 mmHg  Pulse 89  Temp(Src) 98 F (36.7 C) (Oral)  Resp 31  SpO2 96% Physical Exam  Constitutional: He is oriented to person, place, and time. He appears well-developed and well-nourished.  HENT:  Head: Normocephalic and atraumatic.  Right Ear: External ear normal.  Left Ear: External ear normal.  Eyes: Conjunctivae and EOM are normal. Pupils are equal, round, and reactive to light.  Neck: Normal range of motion and phonation normal. Neck supple.  Cardiovascular: Normal rate, regular rhythm and normal heart sounds.   Pulmonary/Chest: Effort normal and breath sounds normal. No respiratory distress. He exhibits no bony tenderness.  Somewhat diminished air movement, bilaterally, with scattered rhonchi.  No audible wheezes.  Abdominal: Soft. There is no tenderness.  Musculoskeletal: Normal range of motion.  No leg pain, tenderness, swelling  Neurological: He is alert and oriented to person, place, and time. No cranial nerve deficit or sensory deficit. He exhibits normal muscle tone. Coordination normal.  Skin: Skin is warm, dry and intact.  Psychiatric: He has a normal mood and affect. His behavior is normal. Judgment and thought content normal.  Nursing note and vitals reviewed.   ED Course  Procedures (including critical care  time)  Medications  albuterol (PROVENTIL HFA;VENTOLIN HFA) 108 (90 BASE) MCG/ACT inhaler 2 puff (not administered)  aerochamber plus with mask device 1 each (not administered)  iohexol (OMNIPAQUE) 350 MG/ML injection 100 mL (100 mLs Intravenous Contrast Given 07/02/14 2057)    Patient Vitals for the past 24 hrs:  BP Temp Temp src Pulse Resp SpO2  07/02/14 2115 116/61 mmHg - - 89 (!) 31 96 %  07/02/14 2045 114/90 mmHg - - - - -  07/02/14 2030 114/76 mmHg - - 83 18 95 %  07/02/14 2015 111/77 mmHg - - 81 - 94 %  07/02/14 2000 120/82 mmHg - - 81 - 95 %  07/02/14 1945 125/77 mmHg - - 82 - 94 %  07/02/14 1930 119/74 mmHg - - 80 - 94 %  07/02/14 1915 122/74 mmHg - - 82 - 94 %  07/02/14 1845 124/77 mmHg - - 80 - 97 %  07/02/14 1830 124/75 mmHg - - 78 - 98 %  07/02/14 1822 104/74 mmHg - - 83 - 97 %  07/02/14 1524 129/77 mmHg 98 F (36.7 C) Oral 81 18 95 %    10:15 PM Reevaluation with update and discussion. After initial assessment and treatment, an updated evaluation reveals no change in clinical status.Mancel Bale. Sanii Kukla L    Labs Review Labs Reviewed  D-DIMER, QUANTITATIVE - Abnormal; Notable for the following:    D-Dimer, Quant 1.19 (*)    All other components within normal limits  I-STAT CHEM 8, ED - Abnormal; Notable for the following:    Glucose, Bld 107 (*)    Calcium, Ion 1.09 (*)    All other components within normal limits    Imaging Review Dg Chest 2 View  07/02/2014   CLINICAL DATA:  Coughing and wheezing for 1 week, hypertension  EXAM: CHEST  2 VIEW  COMPARISON:  07/17/2005  FINDINGS: Enlargement of cardiac silhouette with slight pulmonary vascular congestion.  Mediastinal contours normal.  Chronic bibasilar atelectasis versus scarring.  No definite acute infiltrate, pleural effusion or pneumothorax.  Bones unremarkable.  IMPRESSION: Enlargement of cardiac silhouette with pulmonary vascular congestion.  Chronic bibasilar atelectasis versus scarring.  No definite acute  infiltrate.   Electronically Signed   By: Ulyses SouthwardMark  Boles M.D.   On: 07/02/2014 11:42   Ct Angio Chest Pe W/cm &/or Wo Cm  07/02/2014   CLINICAL DATA:  Cough and diffuse body aches for 1.5 weeks. Abnormal chest x-ray today. Elevated D-dimer.  EXAM: CT ANGIOGRAPHY CHEST WITH CONTRAST  TECHNIQUE: Multidetector CT imaging of the chest was performed using the standard protocol during bolus administration of intravenous contrast. Multiplanar CT image reconstructions and MIPs were obtained to evaluate the vascular anatomy.  CONTRAST:  100mL OMNIPAQUE IOHEXOL 350 MG/ML SOLN  COMPARISON:  07/18/2005  FINDINGS: Technically adequate study with moderately good opacification of the central and segmental pulmonary arteries. No focal filling defects are identified. No evidence of significant  pulmonary embolus  At normal heart size. Normal caliber thoracic aorta. No evidence of aortic dissection. Great vessel origins are patent. Scattered lymph nodes in the mediastinum are not pathologically enlarged. Esophagus is decompressed.  Linear atelectasis or infiltration in both lung bases. No pleural effusions. No pneumothorax. Airways appear patent.  Included portions of the upper abdominal organs demonstrate diffuse fatty infiltration of the liver and surgical absence of the gallbladder. With no destructive bone lesions appreciated.  Review of the MIP images confirms the above findings.  IMPRESSION: No evidence of significant pulmonary embolus. Atelectasis or infiltration in the lung bases bilaterally. Fatty infiltration of the liver.   Electronically Signed   By: Burman Nieves M.D.   On: 07/02/2014 21:25     EKG Interpretation   Date/Time:  Friday July 02 2014 20:31:27 EST Ventricular Rate:  83 PR Interval:  158 QRS Duration: 97 QT Interval:  372 QTC Calculation: 437 R Axis:   -55 Text Interpretation:  Sinus rhythm Left anterior fascicular block Abnormal  R-wave progression, late transition since last tracing no  significant  change Confirmed by Effie Shy  MD, Mechele Collin ([id number]) on 07/02/2014 10:24:33 PM      MDM   Final diagnoses:  Acute bronchitis, unspecified organism     Nonspecific symptoms with cough, and essentially negative  ED evaluation.  Doubt ACS, PE, pneumonia, or serious bacterial infection.  Nursing Notes Reviewed/ Care Coordinated Applicable Imaging Reviewed Interpretation of Laboratory Data incorporated into ED treatment  The patient appears reasonably screened and/or stabilized for discharge and I doubt any other medical condition or other [facility] requiring further screening, evaluation, or treatment in the ED at this time prior to discharge.  Plan: Home Medications- Albuterol inhaler; Home Treatments- rest, fluids; return here if the recommended treatment, does not improve the symptoms; Recommended follow up- PCP prn   Flint Melter, MD 07/02/14 2224

## 2014-07-05 ENCOUNTER — Other Ambulatory Visit: Payer: Self-pay | Admitting: Medical

## 2014-07-05 MED ORDER — HYDROCODONE-HOMATROPINE 5-1.5 MG/5ML PO SYRP: 5.0000 mL | ORAL_SOLUTION | Freq: Three times a day (TID) | ORAL | Status: DC | PRN

## 2014-07-06 ENCOUNTER — Telehealth: Payer: Self-pay | Admitting: Family Medicine

## 2014-07-06 NOTE — Telephone Encounter (Signed)
-----   Message from Jac Canavanavid S Tysinger, PA-C sent at 07/05/2014  2:24 PM EST ----- Please call patient back.  After discussing with Dr. Susann GivensLalonde, we recommend [facility] such as [facility] service or ED supervisor about his experience.  He should have been seen sooner and had fluids given in our opinion given our calling the ED trying to help the transition of care.

## 2014-07-06 NOTE — Telephone Encounter (Signed)
Patient is aware of Shane Tysinger PA message and recommendations 

## 2014-07-07 ENCOUNTER — Telehealth: Payer: Self-pay | Admitting: Family Medicine

## 2014-07-07 NOTE — Telephone Encounter (Signed)
How often is he using the inhaler?  What other symptoms currently?  May need recheck so we can listen to his lungs

## 2014-07-07 NOTE — Telephone Encounter (Signed)
If not already using Benadryl I would take Benadryl at bedtime, consider sleeping inclined the next 2 nights, use nasal saline flush and salt water gargles to clear out mucous before bedtime

## 2014-07-07 NOTE — Telephone Encounter (Signed)
Patient states that he is using the inhaler every 3 to 4 hours. He said that when he lay's down to go to sleep he gets a tickle in his throat that makes him cough and he can't get any rest. He said he is  Not gasping for breathe or anything like that he just gets a tickle in his throat that makes him cough and the cough medication is not helping

## 2014-07-07 NOTE — Telephone Encounter (Signed)
PT called and states still having difficulty breathing and missing work.  He request you to call [phone]

## 2014-07-07 NOTE — Telephone Encounter (Signed)
Patient is aware of Shane Tysinger PA message in detail 

## 2014-07-12 ENCOUNTER — Encounter: Payer: Self-pay | Admitting: Medical

## 2014-07-12 ENCOUNTER — Ambulatory Visit (INDEPENDENT_AMBULATORY_CARE_PROVIDER_SITE_OTHER): Payer: BLUE CROSS/BLUE SHIELD | Admitting: Medical

## 2014-07-12 ENCOUNTER — Ambulatory Visit
Admission: RE | Admit: 2014-07-12 | Discharge: 2014-07-12 | Disposition: A | Discharge status: Home / Self Care | Payer: BLUE CROSS/BLUE SHIELD | Source: Ambulatory Visit | Attending: Medical | Admitting: Medical

## 2014-07-12 ENCOUNTER — Other Ambulatory Visit: Payer: Self-pay | Admitting: Medical

## 2014-07-12 VITALS — BP 132/82 | HR 95 | Temp 97.7°F | Resp 20 | Wt 261.0 lb

## 2014-07-12 DIAGNOSIS — R0600 Dyspnea, unspecified: Secondary | ICD-10-CM

## 2014-07-12 DIAGNOSIS — R062 Wheezing: Secondary | ICD-10-CM

## 2014-07-12 DIAGNOSIS — R059 Cough, unspecified: Secondary | ICD-10-CM

## 2014-07-12 DIAGNOSIS — R05 Cough: Secondary | ICD-10-CM

## 2014-07-12 DIAGNOSIS — R509 Fever, unspecified: Secondary | ICD-10-CM

## 2014-07-12 LAB — CBC WITH DIFFERENTIAL/PLATELET
BASOS ABS: 0 10*3/uL (ref 0.0–0.1)
EOS ABS: 0 10*3/uL (ref 0.0–0.7)
HEMATOCRIT: 40 % (ref 39.0–52.0)
HEMOGLOBIN: 13.3 g/dL (ref 13.0–17.0)
Lymphocytes Relative: 17 % (ref 12–46)
Lymphs Abs: 2 10*3/uL (ref 0.7–4.0)
MCH: 28.4 pg (ref 26.0–34.0)
MCHC: 33.1 g/dL (ref 30.0–36.0)
MCV: 85.2 fL (ref 78.0–100.0)
MONO ABS: 0.7 10*3/uL (ref 0.1–1.0)
MONOS PCT: 6 % (ref 3–12)
NEUTROS ABS: 9 10*3/uL — AB (ref 1.7–7.7)
Neutrophils Relative %: 77 % (ref 43–77)
PLATELETS: 471 10*3/uL — AB (ref 150–400)
RBC: 4.69 MIL/uL (ref 4.22–5.81)
RDW: 13.6 % (ref 11.5–15.5)
WBC: 11.7 10*3/uL — ABNORMAL HIGH (ref 4.0–10.5)

## 2014-07-12 LAB — COMPREHENSIVE METABOLIC PANEL
ALBUMIN: 3 g/dL — AB (ref 3.5–5.2)
ALT: 26 U/L (ref 0–53)
AST: 34 U/L (ref 0–37)
Alkaline Phosphatase: 54 U/L (ref 39–117)
BILIRUBIN TOTAL: 0.5 mg/dL (ref 0.2–1.2)
BUN: 10 mg/dL (ref 6–23)
CALCIUM: 8.9 mg/dL (ref 8.4–10.5)
CHLORIDE: 97 meq/L (ref 96–112)
CO2: 26 mEq/L (ref 19–32)
Creat: 0.8 mg/dL (ref 0.50–1.35)
Glucose, Bld: 137 mg/dL — ABNORMAL HIGH (ref 70–99)
POTASSIUM: 3.7 meq/L (ref 3.5–5.3)
Sodium: 133 mEq/L — ABNORMAL LOW (ref 135–145)
TOTAL PROTEIN: 7 g/dL (ref 6.0–8.3)

## 2014-07-12 MED ORDER — LEVOFLOXACIN 750 MG PO TABS: 750.0000 mg | ORAL_TABLET | Freq: Every day | ORAL | Status: DC

## 2014-07-12 MED ORDER — IPRATROPIUM-ALBUTEROL 0.5-2.5 (3) MG/3ML IN SOLN
3.0000 mL | Freq: Once | RESPIRATORY_TRACT | Status: DC
Start: 2014-07-12 — End: 2017-10-09

## 2014-07-12 NOTE — Addendum Note (Signed)
Addended by: Janeice RobinsonSCALES, Kash [place] L on: 07/12/2014 03:43 PM   Modules accepted: Orders

## 2014-07-12 NOTE — Progress Notes (Signed)
Subjective: Here for illness, recheck.  I saw him 07/02/14 for same.  He has also seen the emergency dept 07/02/14.  Since last visit still having cough, productive sputum, dyspnea, fever up to 100, chills, nausea with bad cough spells, sinus pressure, feverish on and off, bad cough spells at time.  Called after hours line few nights ago regarding cough spells.   Overall not much improved since he first saw Dr. Susann GivensLalonde here on 06/28/14.  At this point he has completed a round of amoxicillin, has used antihistamine, albuterol inhaler started at the ED, tessalon perles and hycodan cough syrup.  No other sick contacts.  Denies weight gain, leg edema, chest pain, palpations.    No other aggravating or relieving factors. No other complaint.  ROS as in subjective  Objective BP 132/82 mmHg  Pulse 95  Temp(Src) 97.7 F (36.5 C) (Oral)  Resp 20  Wt 261 lb (118.389 kg)  SpO2 90%   Wt Readings from Last 3 Encounters:  07/12/14 261 lb (118.389 kg)  07/02/14 273 lb (123.832 kg)  06/28/14 273 lb (123.832 kg)    Gen: wd, wn, nad Skin: warm, dry HENT: mild sinus tenderness, TMs with normal, nares with turbinate edema, clear discharge, pharynx with erythema Mouth: MMM, no lesions Lungs: decrease breath sounds somewhat, +rhonchi,+rales, faint diffuse wheezing Heart: RRR, normal s1, s2, no murmurs Ext: no edema Neck: supple, nontender, no mass or thyromegaly    Assessment: Encounter Diagnoses  Name Primary?  . Cough Yes  . Dyspnea   . Wheezing   . Fever and chills     Plan: Discussed case with supervising physician Dr. Susann GivensLalonde.  Reviewed his 07/02/14 visit with me and the ED, reviewed Chest CT and Chest Xray from 07/02/14.   At this point he is 90% pulse ox room air.   Gave 1 round of duoneb and there was some improvement in lung sounds.  Will get repeat STAT labs today, Chest xray, begin Symbicort sample, c/t Albuterol prn, rest, hydrate well.  We will call back with lab results.

## 2014-07-15 ENCOUNTER — Ambulatory Visit (INDEPENDENT_AMBULATORY_CARE_PROVIDER_SITE_OTHER): Payer: BLUE CROSS/BLUE SHIELD | Admitting: Medical

## 2014-07-15 ENCOUNTER — Encounter: Payer: Self-pay | Admitting: Medical

## 2014-07-15 VITALS — BP 120/70 | HR 72 | Temp 97.6°F | Resp 18 | Wt 256.0 lb

## 2014-07-15 DIAGNOSIS — R0902 Hypoxemia: Secondary | ICD-10-CM

## 2014-07-15 DIAGNOSIS — R5383 Other fatigue: Secondary | ICD-10-CM

## 2014-07-15 DIAGNOSIS — J189 Pneumonia, unspecified organism: Secondary | ICD-10-CM

## 2014-07-15 MED ORDER — HYDROCODONE-ACETAMINOPHEN 7.5-325 MG PO TABS: 1.0000 | ORAL_TABLET | Freq: Four times a day (QID) | ORAL | Status: DC | PRN

## 2014-07-15 MED ORDER — LEVOFLOXACIN 750 MG PO TABS: 750.0000 mg | ORAL_TABLET | Freq: Every day | ORAL | Status: DC

## 2014-07-15 NOTE — Progress Notes (Signed)
Subjective: Here recheck on pneumonia.  I saw him on 07/12/14 this week for the same as well as 07/02/14 for same.  He has also seen the emergency dept 07/02/14.  Since last visit still having cough, productive sputum, but after starting Levaquin, Mucinex DM, albuterol, finally feels like he is getting mucus out and improving, feels about 20 or 30% better. Appetite is fine, his main concern is a tickle in the throat particular at night when he lays down.  Overall improved from the standpoint of fever, chills, nausea, sinus pressure.  Denies weight gain, leg edema, chest pain, palpations.    No other aggravating or relieving factors. No other complaint.  ROS as in subjective  Objective BP 120/70 mmHg  Pulse 72  Temp(Src) 97.6 F (36.4 C) (Oral)  Resp 18  Wt 256 lb (116.121 kg)  SpO2 90%   Wt Readings from Last 3 Encounters:  07/15/14 256 lb (116.121 kg)  07/12/14 261 lb (118.389 kg)  07/02/14 273 lb (123.832 kg)    Gen: wd, wn, nad Skin: warm, dry HENT: No sinus tenderness, TMs with normal, nares with turbinate edema, clear discharge, pharynx with erythema Mouth: MMM, no lesions Lungs: decrease breath sounds somewhat, +rales lower fields, no wheezes Heart: RRR, normal s1, s2, no murmurs Ext: no edema Neck: supple, nontender, no mass or thyromegaly    Assessment: Encounter Diagnoses  Name Primary?  . Community acquired pneumonia Yes  . Other fatigue   . Hypoxemia     Plan: Discussed case with supervising physician Dr. Susann GivensLalonde.  At this point continue Levaquin, will go longer than 1 week on Levaquin, continue albuterol every 6 hours, prescribed hydrocodone for cough suppression and help with a tickle in his throat to get rest at night, c/t Mucinex DM, good hydration, rest. F/u 1wk, sooner prn.  FMLA completed yesterday.

## 2015-01-28 ENCOUNTER — Encounter: Payer: Self-pay | Admitting: Family Medicine

## 2015-01-28 ENCOUNTER — Ambulatory Visit (INDEPENDENT_AMBULATORY_CARE_PROVIDER_SITE_OTHER): Payer: BLUE CROSS/BLUE SHIELD | Admitting: Family Medicine

## 2015-01-28 VITALS — BP 128/82 | HR 72 | Temp 98.1°F | Wt 276.4 lb

## 2015-01-28 DIAGNOSIS — J019 Acute sinusitis, unspecified: Secondary | ICD-10-CM

## 2015-01-28 DIAGNOSIS — B9689 Other specified bacterial agents as the cause of diseases classified elsewhere: Secondary | ICD-10-CM

## 2015-01-28 DIAGNOSIS — J029 Acute pharyngitis, unspecified: Secondary | ICD-10-CM | POA: Diagnosis not present

## 2015-01-28 LAB — POCT RAPID STREP A (OFFICE): Rapid Strep A Screen: NEGATIVE

## 2015-01-28 MED ORDER — AMOXICILLIN 875 MG PO TABS: 875.0000 mg | ORAL_TABLET | Freq: Two times a day (BID) | ORAL | Status: DC

## 2015-01-28 MED ORDER — BENZONATATE 200 MG PO CAPS: 200.0000 mg | ORAL_CAPSULE | Freq: Two times a day (BID) | ORAL | Status: DC | PRN

## 2015-01-28 NOTE — Patient Instructions (Signed)
Continue treating your symptoms as you have been with Zyrtec, Flonase, ibuprofen, and rest. Make sure you are staying hydrated. Also, I am prescribing an antibiotic for you as well as cough medicine. Let me know if you are not feeling better when finished with the antibiotic. You should be back to baseline by then.    Sinusitis Sinusitis is redness, soreness, and inflammation of the paranasal sinuses. Paranasal sinuses are air pockets within the bones of your face (beneath the eyes, the middle of the forehead, or above the eyes). In healthy paranasal sinuses, mucus is able to drain out, and air is able to circulate through them by way of your nose. However, when your paranasal sinuses are inflamed, mucus and air can become trapped. This can allow bacteria and other germs to grow and cause infection. Sinusitis can develop quickly and last only a short time (acute) or continue over a Tortorelli period (chronic). Sinusitis that lasts for more than 12 weeks is considered chronic.  CAUSES  Causes of sinusitis include:  Allergies.  Structural abnormalities, such as displacement of the cartilage that separates your nostrils (deviated septum), which can decrease the air flow through your nose and sinuses and affect sinus drainage.  Functional abnormalities, such as when the small hairs (cilia) that line your sinuses and help remove mucus do not work properly or are not present. SIGNS AND SYMPTOMS  Symptoms of acute and chronic sinusitis are the same. The primary symptoms are pain and pressure around the affected sinuses. Other symptoms include:  Upper toothache.  Earache.  Headache.  Bad breath.  Decreased sense of smell and taste.  A cough, which worsens when you are lying flat.  Fatigue.  Fever.  Thick drainage from your nose, which often is green and may contain pus (purulent).  Swelling and warmth over the affected sinuses. DIAGNOSIS  Your health care provider will perform a physical  exam. During the exam, your health care provider may:  Look in your nose for signs of abnormal growths in your nostrils (nasal polyps).  Tap over the affected sinus to check for signs of infection.  View the inside of your sinuses (endoscopy) using an imaging device that has a light attached (endoscope). If your health care provider suspects that you have chronic sinusitis, one or more of the following tests may be recommended:  Allergy tests.  Nasal culture. A sample of mucus is taken from your nose, sent to a lab, and screened for bacteria.  Nasal cytology. A sample of mucus is taken from your nose and examined by your health care provider to determine if your sinusitis is related to an allergy. TREATMENT  Most cases of acute sinusitis are related to a viral infection and will resolve on their own within 10 days. Sometimes medicines are prescribed to help relieve symptoms (pain medicine, decongestants, nasal steroid sprays, or saline sprays).  However, for sinusitis related to a bacterial infection, your health care provider will prescribe antibiotic medicines. These are medicines that will help kill the bacteria causing the infection.  Rarely, sinusitis is caused by a fungal infection. In theses cases, your health care provider will prescribe antifungal medicine. For some cases of chronic sinusitis, surgery is needed. Generally, these are cases in which sinusitis recurs more than 3 times per year, despite other treatments. HOME CARE INSTRUCTIONS   Drink plenty of water. Water helps thin the mucus so your sinuses can drain more easily.  Use a humidifier.  Inhale steam 3 to 4 times a  day (for example, sit in the bathroom with the shower running).  Apply a warm, moist washcloth to your face 3 to 4 times a day, or as directed by your health care provider.  Use saline nasal sprays to help moisten and clean your sinuses.  Take medicines only as directed by your health care provider.  If  you were prescribed either an antibiotic or antifungal medicine, finish it all even if you start to feel better. SEEK IMMEDIATE MEDICAL CARE IF:  You have increasing pain or severe headaches.  You have nausea, vomiting, or drowsiness.  You have swelling around your face.  You have vision problems.  You have a stiff neck.  You have difficulty breathing. MAKE SURE YOU:   Understand these instructions.  Will watch your condition.  Will get help right away if you are not doing well or get worse. Document Released: 05/21/2005 Document Revised: 10/05/2013 Document Reviewed: 06/05/2011 [facility] Patient Information 2015 [place], [place]. This information is not intended to replace advice given to you by your health care provider. Make sure you discuss any questions you have with your health care provider.

## 2015-01-28 NOTE — Progress Notes (Signed)
   Subjective:    Patient ID: Phillip Ortega, male    DOB: [birth year], 54 y.o.   MRN: [id number]  HPI He is here for a 5 day history of URI symptoms that started with sinus pain, congestion and sore throat and has progressively gotten worse and he now complains of postnasal drainage, nonproductive cough, body aches. He states he saw white spots in the back of his throat and decided he should come in. He denies fever, chills, nausea, vomiting, diarrhea, ear pain. He states the cough is keeping him up at night and he has been taking NyQuil and ibuprofen. He has also been taking Flonase and Zyrtec for his underlying allergies. He states he was unable to work yesterday and today due to his illness. He is not a smoker. Denies sick contacts and has not taken antibiotic in the past several months.  Reviewed allergies, medications, past medical history.    Review of Systems Pertinent positives and negatives in the history of present illness. Systems otherwise negative.    Objective:   Physical Exam  Constitutional: He is oriented to person, place, and time. He appears well-developed and well-nourished. He has a sickly appearance. No distress.  HENT:  Right Ear: External ear normal.  Left Ear: External ear normal.  Nose: Mucosal edema present. Right sinus exhibits maxillary sinus tenderness and frontal sinus tenderness. Left sinus exhibits maxillary sinus tenderness and frontal sinus tenderness.  Mouth/Throat: Uvula is midline and mucous membranes are normal. No uvula swelling. Oropharyngeal exudate and posterior oropharyngeal erythema present. No posterior oropharyngeal edema.  Neck: Normal range of motion. Neck supple.  Pulmonary/Chest: Effort normal and breath sounds normal.  Lymphadenopathy:    He has cervical adenopathy.  Neurological: He is alert and oriented to person, place, and time.  Skin: Skin is warm and dry.      Rapid strep negative.    Assessment & Plan:  Acute bacterial  rhinosinusitis - Plan: benzonatate (TESSALON) 200 MG capsule, amoxicillin (AMOXIL) 875 MG tablet  Sore throat - Plan: Rapid Strep A  His strep test is negative today. His symptoms are suspicious for bacterial infection. He will continue symptomatic treatment as well as taking his allergy medications. I will prescribe cough medication and an antibiotic. He will let us know if he is not back to his baseline after completing the antibiotic.

## 2015-06-05 DIAGNOSIS — I639 Cerebral infarction, unspecified: Secondary | ICD-10-CM

## 2015-06-05 HISTORY — DX: Cerebral infarction, unspecified: I63.9

## 2015-06-20 ENCOUNTER — Telehealth: Payer: Self-pay | Admitting: Family Medicine

## 2015-06-20 NOTE — Telephone Encounter (Signed)
Pt's wife, Sue Lushndrea, called requesting to speak to Dr Susann GivensLalonde to update him on status of pt. Pt was out of town this weekend and blacked out and so far his diagnosis is TIA. Sue Lushndrea will sign record release so that records will be sent to our office and she would like for Dr Susann GivensLalonde to call her today at [phone] when he can.

## 2015-06-21 ENCOUNTER — Telehealth: Payer: Self-pay

## 2015-06-21 NOTE — Telephone Encounter (Signed)
Sue Lush called on behalf of Ashkan. Ashley broke his arm this past Saturday night, it is a compound fracture. The hospital is supposed to be scheduling him for a f/u appt with [place] (haven't yet). He has been told he will need surgery within 10 days of the break to fix it. He is needing a referral from [place] to an Orthopaedic surgeon to get his leg fixed. Right now they are out of town but said they could be back for an appt with [place] Friday morning @ 8:00 (I scheduled them for that time).

## 2015-06-24 ENCOUNTER — Encounter: Payer: Self-pay | Admitting: Family Medicine

## 2015-06-24 ENCOUNTER — Ambulatory Visit (INDEPENDENT_AMBULATORY_CARE_PROVIDER_SITE_OTHER): Payer: BLUE CROSS/BLUE SHIELD | Admitting: Family Medicine

## 2015-06-24 VITALS — BP 130/82 | HR 60 | Wt 284.0 lb

## 2015-06-24 DIAGNOSIS — S42402A Unspecified fracture of lower end of left humerus, initial encounter for closed fracture: Secondary | ICD-10-CM

## 2015-06-24 DIAGNOSIS — G459 Transient cerebral ischemic attack, unspecified: Secondary | ICD-10-CM

## 2015-06-24 NOTE — Progress Notes (Signed)
   Subjective:    Patient ID: Phillip Ortega, male    DOB: 08-Jan-1961, 55 y.o.   MRN: [id number]  HPI He is here for a follow-up visit. He injured himself January 14 and ended up having a fracture dislocation of his left elbow. He was seen in [place]. He was admitted to the hospital for care of this and also subsequently found to have left facial weakness and slurring of speech. The weakness and the slurred speech did clear rather quickly. He was evaluated with a CT scan, carotid Doppler and echocardiogram all of which was negative. He was placed on aspirin. Also given pain medications for the elbow. He was sent here to have further surgery done by a local physician rather than being done down in [facility].Presently he is doing fairly well only complaining of pain in having no other focal neurologic deficits.   Review of Systems     Objective:   Physical Exam Alert and in no distress. Sling is noted on the left arm. His speech pattern is normal. The hospital record was reviewed in detail including echocardiogram carotid Doppler CT scanning.       Assessment & Plan:  Fracture dislocation of left elbow joint, closed, initial encounter - Plan: AMB referral to orthopedics  Transient cerebral ischemia, unspecified transient cerebral ischemia type - Plan: Ambulatory referral to Neurology Discussed proper care for the fracture and we'll set this up to be seen today. He will also be scheduled to see neurology for follow-up concerning the TIA and any further diagnostic evaluations. He will continue on his pain medication and aspirin. Over 25 minutes, greater than 50% spent in counseling and coordination of care.

## 2015-06-25 ENCOUNTER — Encounter (HOSPITAL_COMMUNITY)
Admission: RE | Disposition: A | Discharge status: Home Health | Payer: Self-pay | Source: Ambulatory Visit | Attending: Orthopedic Surgery

## 2015-06-25 ENCOUNTER — Encounter (HOSPITAL_COMMUNITY): Payer: Self-pay | Admitting: *Deleted

## 2015-06-25 ENCOUNTER — Inpatient Hospital Stay (HOSPITAL_COMMUNITY): Payer: BLUE CROSS/BLUE SHIELD

## 2015-06-25 ENCOUNTER — Ambulatory Visit (HOSPITAL_COMMUNITY): Payer: BLUE CROSS/BLUE SHIELD | Admitting: Anesthesiology

## 2015-06-25 ENCOUNTER — Ambulatory Visit (HOSPITAL_COMMUNITY): Payer: BLUE CROSS/BLUE SHIELD

## 2015-06-25 ENCOUNTER — Inpatient Hospital Stay (HOSPITAL_COMMUNITY)
Admission: RE | Admit: 2015-06-25 | Discharge: 2015-06-27 | DRG: 483 | Disposition: A | Discharge status: Home Health | Payer: BLUE CROSS/BLUE SHIELD | Source: Ambulatory Visit | Attending: Orthopedic Surgery | Admitting: Orthopedic Surgery

## 2015-06-25 DIAGNOSIS — Z419 Encounter for procedure for purposes other than remedying health state, unspecified: Secondary | ICD-10-CM

## 2015-06-25 DIAGNOSIS — Z6837 Body mass index (BMI) 37.0-37.9, adult: Secondary | ICD-10-CM

## 2015-06-25 DIAGNOSIS — F411 Generalized anxiety disorder: Secondary | ICD-10-CM | POA: Diagnosis present

## 2015-06-25 DIAGNOSIS — F32A Depression, unspecified: Secondary | ICD-10-CM | POA: Diagnosis present

## 2015-06-25 DIAGNOSIS — S53105A Unspecified dislocation of left ulnohumeral joint, initial encounter: Secondary | ICD-10-CM | POA: Diagnosis present

## 2015-06-25 DIAGNOSIS — R05 Cough: Secondary | ICD-10-CM

## 2015-06-25 DIAGNOSIS — S53125A Posterior dislocation of left ulnohumeral joint, initial encounter: Secondary | ICD-10-CM | POA: Diagnosis present

## 2015-06-25 DIAGNOSIS — R059 Cough, unspecified: Secondary | ICD-10-CM

## 2015-06-25 DIAGNOSIS — S53104A Unspecified dislocation of right ulnohumeral joint, initial encounter: Secondary | ICD-10-CM

## 2015-06-25 DIAGNOSIS — T148XXA Other injury of unspecified body region, initial encounter: Secondary | ICD-10-CM

## 2015-06-25 DIAGNOSIS — M25522 Pain in left elbow: Secondary | ICD-10-CM | POA: Diagnosis present

## 2015-06-25 DIAGNOSIS — R0600 Dyspnea, unspecified: Secondary | ICD-10-CM

## 2015-06-25 DIAGNOSIS — I10 Essential (primary) hypertension: Secondary | ICD-10-CM | POA: Diagnosis present

## 2015-06-25 DIAGNOSIS — S52122A Displaced fracture of head of left radius, initial encounter for closed fracture: Principal | ICD-10-CM | POA: Diagnosis present

## 2015-06-25 DIAGNOSIS — F329 Major depressive disorder, single episode, unspecified: Secondary | ICD-10-CM | POA: Diagnosis present

## 2015-06-25 HISTORY — DX: Unspecified dislocation of left ulnohumeral joint, initial encounter: S53.105A

## 2015-06-25 HISTORY — PX: ORIF ELBOW FRACTURE: SHX5031

## 2015-06-25 HISTORY — DX: Transient cerebral ischemic attack, unspecified: G45.9

## 2015-06-25 SURGERY — OPEN REDUCTION INTERNAL FIXATION (ORIF) ELBOW/OLECRANON FRACTURE
Anesthesia: General | Site: Arm Upper | Laterality: Left

## 2015-06-25 MED ORDER — MIDAZOLAM HCL 2 MG/2ML IJ SOLN
INTRAMUSCULAR | Status: AC
  Filled 2015-06-25: qty 2

## 2015-06-25 MED ORDER — CEFAZOLIN SODIUM-DEXTROSE 2-3 GM-% IV SOLR
2.0000 g | Freq: Four times a day (QID) | INTRAVENOUS | Status: AC
  Administered 2015-06-25 – 2015-06-26 (×3): 2 g via INTRAVENOUS
  Filled 2015-06-25 (×3): qty 50

## 2015-06-25 MED ORDER — CEFAZOLIN SODIUM-DEXTROSE 2-3 GM-% IV SOLR
2.0000 g | Freq: Once | INTRAVENOUS | Status: AC
  Administered 2015-06-25: 2 g via INTRAVENOUS

## 2015-06-25 MED ORDER — LACTATED RINGERS IV SOLN
INTRAVENOUS | Status: DC
Start: 2015-06-25 — End: 2015-06-27
  Administered 2015-06-25: 10:00:00 via INTRAVENOUS

## 2015-06-25 MED ORDER — FENTANYL CITRATE (PF) 250 MCG/5ML IJ SOLN
INTRAMUSCULAR | Status: AC
  Filled 2015-06-25: qty 5

## 2015-06-25 MED ORDER — METHOCARBAMOL 1000 MG/10ML IJ SOLN: 500.0000 mg | Freq: Four times a day (QID) | INTRAVENOUS | Status: DC

## 2015-06-25 MED ORDER — CARVEDILOL 6.25 MG PO TABS
6.2500 mg | ORAL_TABLET | Freq: Once | ORAL | Status: AC
  Administered 2015-06-25: 6.25 mg via ORAL

## 2015-06-25 MED ORDER — LIDOCAINE HCL (CARDIAC) 20 MG/ML IV SOLN
INTRAVENOUS | Status: DC | PRN
  Administered 2015-06-25: 80 mg via INTRATRACHEAL

## 2015-06-25 MED ORDER — PROMETHAZINE HCL 25 MG/ML IJ SOLN: 6.2500 mg | INTRAMUSCULAR | Status: DC | PRN

## 2015-06-25 MED ORDER — DIPHENHYDRAMINE HCL 12.5 MG/5ML PO ELIX: 12.5000 mg | ORAL_SOLUTION | ORAL | Status: DC | PRN

## 2015-06-25 MED ORDER — ENOXAPARIN SODIUM 40 MG/0.4ML ~~LOC~~ SOLN
40.0000 mg | SUBCUTANEOUS | Status: DC
  Administered 2015-06-26 – 2015-06-27 (×2): 40 mg via SUBCUTANEOUS
  Filled 2015-06-25 (×2): qty 0.4

## 2015-06-25 MED ORDER — FLUOXETINE HCL 20 MG PO CAPS
20.0000 mg | ORAL_CAPSULE | Freq: Every day | ORAL | Status: DC
  Administered 2015-06-25 – 2015-06-27 (×3): 20 mg via ORAL
  Filled 2015-06-25 (×3): qty 1

## 2015-06-25 MED ORDER — BUPROPION HCL 100 MG PO TABS
100.0000 mg | ORAL_TABLET | Freq: Every day | ORAL | Status: DC
  Administered 2015-06-25 – 2015-06-27 (×3): 100 mg via ORAL
  Filled 2015-06-25 (×3): qty 1

## 2015-06-25 MED ORDER — NEOSTIGMINE METHYLSULFATE 10 MG/10ML IV SOLN
INTRAVENOUS | Status: DC | PRN
  Administered 2015-06-25: 3 mg via INTRAVENOUS

## 2015-06-25 MED ORDER — CARVEDILOL 6.25 MG PO TABS
6.2500 mg | ORAL_TABLET | Freq: Two times a day (BID) | ORAL | Status: DC
  Administered 2015-06-25 – 2015-06-27 (×4): 6.25 mg via ORAL
  Filled 2015-06-25 (×5): qty 1

## 2015-06-25 MED ORDER — HYDROMORPHONE HCL 1 MG/ML IJ SOLN: 0.2500 mg | INTRAMUSCULAR | Status: DC | PRN

## 2015-06-25 MED ORDER — ACETAMINOPHEN 325 MG PO TABS: 650.0000 mg | ORAL_TABLET | Freq: Four times a day (QID) | ORAL | Status: DC | PRN

## 2015-06-25 MED ORDER — 0.9 % SODIUM CHLORIDE (POUR BTL) OPTIME
TOPICAL | Status: DC | PRN
  Administered 2015-06-25: 1000 mL

## 2015-06-25 MED ORDER — ONDANSETRON HCL 4 MG/2ML IJ SOLN: 4.0000 mg | Freq: Four times a day (QID) | INTRAMUSCULAR | Status: DC | PRN

## 2015-06-25 MED ORDER — FENTANYL CITRATE (PF) 100 MCG/2ML IJ SOLN
INTRAMUSCULAR | Status: AC
  Filled 2015-06-25: qty 2

## 2015-06-25 MED ORDER — HYDROMORPHONE HCL 1 MG/ML IJ SOLN
1.0000 mg | INTRAMUSCULAR | Status: DC | PRN
  Administered 2015-06-26 – 2015-06-27 (×6): 2 mg via INTRAVENOUS
  Filled 2015-06-25 (×6): qty 2

## 2015-06-25 MED ORDER — POLYETHYLENE GLYCOL 3350 17 G PO PACK
17.0000 g | PACK | Freq: Every day | ORAL | Status: DC
  Administered 2015-06-27: 17 g via ORAL
  Filled 2015-06-25: qty 1

## 2015-06-25 MED ORDER — BUPIVACAINE-EPINEPHRINE (PF) 0.5% -1:200000 IJ SOLN
INTRAMUSCULAR | Status: DC | PRN
  Administered 2015-06-25: 30 mL via PERINEURAL

## 2015-06-25 MED ORDER — MAGNESIUM CITRATE PO SOLN: 1.0000 | Freq: Once | ORAL | Status: DC | PRN

## 2015-06-25 MED ORDER — CEFAZOLIN SODIUM-DEXTROSE 2-3 GM-% IV SOLR
INTRAVENOUS | Status: AC
  Filled 2015-06-25: qty 50

## 2015-06-25 MED ORDER — MIDAZOLAM HCL 5 MG/5ML IJ SOLN
INTRAMUSCULAR | Status: DC | PRN
  Administered 2015-06-25: 2 mg via INTRAVENOUS

## 2015-06-25 MED ORDER — ONDANSETRON HCL 4 MG PO TABS: 4.0000 mg | ORAL_TABLET | Freq: Four times a day (QID) | ORAL | Status: DC | PRN

## 2015-06-25 MED ORDER — CARVEDILOL 3.125 MG PO TABS
ORAL_TABLET | ORAL | Status: AC
  Filled 2015-06-25: qty 2

## 2015-06-25 MED ORDER — OXYCODONE HCL 5 MG PO TABS
10.0000 mg | ORAL_TABLET | Freq: Four times a day (QID) | ORAL | Status: DC | PRN
  Administered 2015-06-26 – 2015-06-27 (×3): 20 mg via ORAL
  Filled 2015-06-25 (×4): qty 4

## 2015-06-25 MED ORDER — METHOCARBAMOL 500 MG PO TABS
1000.0000 mg | ORAL_TABLET | Freq: Four times a day (QID) | ORAL | Status: DC
  Administered 2015-06-25 – 2015-06-27 (×7): 1000 mg via ORAL
  Filled 2015-06-25 (×8): qty 2

## 2015-06-25 MED ORDER — BISACODYL 10 MG RE SUPP: 10.0000 mg | Freq: Every day | RECTAL | Status: DC | PRN

## 2015-06-25 MED ORDER — ONDANSETRON HCL 4 MG/2ML IJ SOLN
INTRAMUSCULAR | Status: DC | PRN
  Administered 2015-06-25: 4 mg via INTRAVENOUS

## 2015-06-25 MED ORDER — GLYCOPYRROLATE 0.2 MG/ML IJ SOLN
INTRAMUSCULAR | Status: DC | PRN
  Administered 2015-06-25: .4 mg via INTRAVENOUS

## 2015-06-25 MED ORDER — METOCLOPRAMIDE HCL 5 MG PO TABS: 5.0000 mg | ORAL_TABLET | Freq: Three times a day (TID) | ORAL | Status: DC | PRN

## 2015-06-25 MED ORDER — EPHEDRINE SULFATE 50 MG/ML IJ SOLN
INTRAMUSCULAR | Status: DC | PRN
Start: 2015-06-25 — End: 2015-06-25
  Administered 2015-06-25 (×3): 15 mg via INTRAVENOUS

## 2015-06-25 MED ORDER — PROPOFOL 10 MG/ML IV BOLUS
INTRAVENOUS | Status: AC
  Filled 2015-06-25: qty 20

## 2015-06-25 MED ORDER — METOCLOPRAMIDE HCL 5 MG/ML IJ SOLN: 5.0000 mg | Freq: Three times a day (TID) | INTRAMUSCULAR | Status: DC | PRN

## 2015-06-25 MED ORDER — IPRATROPIUM-ALBUTEROL 0.5-2.5 (3) MG/3ML IN SOLN
3.0000 mL | Freq: Once | RESPIRATORY_TRACT | Status: AC
  Administered 2015-06-25: 3 mL via RESPIRATORY_TRACT
  Filled 2015-06-25: qty 3

## 2015-06-25 MED ORDER — ROCURONIUM BROMIDE 100 MG/10ML IV SOLN
INTRAVENOUS | Status: DC | PRN
  Administered 2015-06-25: 50 mg via INTRAVENOUS

## 2015-06-25 MED ORDER — SUCCINYLCHOLINE CHLORIDE 20 MG/ML IJ SOLN
INTRAMUSCULAR | Status: DC | PRN
  Administered 2015-06-25: 120 mg via INTRAVENOUS

## 2015-06-25 MED ORDER — ACETAMINOPHEN 500 MG PO TABS
1000.0000 mg | ORAL_TABLET | Freq: Four times a day (QID) | ORAL | Status: DC
  Administered 2015-06-25 – 2015-06-27 (×6): 1000 mg via ORAL
  Filled 2015-06-25 (×6): qty 2

## 2015-06-25 MED ORDER — DOCUSATE SODIUM 100 MG PO CAPS
100.0000 mg | ORAL_CAPSULE | Freq: Two times a day (BID) | ORAL | Status: DC
  Administered 2015-06-25 – 2015-06-27 (×4): 100 mg via ORAL
  Filled 2015-06-25 (×4): qty 1

## 2015-06-25 MED ORDER — POTASSIUM CHLORIDE IN NACL 20-0.9 MEQ/L-% IV SOLN
INTRAVENOUS | Status: DC
  Administered 2015-06-25: 19:00:00 via INTRAVENOUS
  Administered 2015-06-26: 1 mL via INTRAVENOUS
  Administered 2015-06-26: 12:00:00 via INTRAVENOUS
  Filled 2015-06-25 (×3): qty 1000

## 2015-06-25 MED ORDER — FENTANYL CITRATE (PF) 250 MCG/5ML IJ SOLN
INTRAMUSCULAR | Status: DC | PRN
  Administered 2015-06-25 (×2): 100 ug via INTRAVENOUS
  Administered 2015-06-25: 50 ug via INTRAVENOUS
  Administered 2015-06-25: 100 ug via INTRAVENOUS

## 2015-06-25 MED ORDER — PROPOFOL 10 MG/ML IV BOLUS
INTRAVENOUS | Status: DC | PRN
  Administered 2015-06-25: 180 mg via INTRAVENOUS

## 2015-06-25 MED ORDER — ACETAMINOPHEN 650 MG RE SUPP: 650.0000 mg | Freq: Four times a day (QID) | RECTAL | Status: DC | PRN

## 2015-06-25 SURGICAL SUPPLY — 87 items
ANATOMIC RADIAL HEAD SYS 22 (Orthopedic Implant) ×1 IMPLANT
ANATOMIC RADIAL HEAD SYS 22MM (Orthopedic Implant) ×1 IMPLANT
APL SKNCLS STERI-STRIP NONHPOA (GAUZE/BANDAGES/DRESSINGS)
BANDAGE ACE 3X5.8 VEL STRL LF (GAUZE/BANDAGES/DRESSINGS) ×4 IMPLANT
BANDAGE ELASTIC 3 VELCRO ST LF (GAUZE/BANDAGES/DRESSINGS) ×3 IMPLANT
BANDAGE ELASTIC 4 VELCRO ST LF (GAUZE/BANDAGES/DRESSINGS) ×3 IMPLANT
BANDAGE ELASTIC 6 VELCRO ST LF (GAUZE/BANDAGES/DRESSINGS) ×3 IMPLANT
BENZOIN TINCTURE PRP APPL 2/3 (GAUZE/BANDAGES/DRESSINGS) IMPLANT
BLADE AVERAGE 25MMX9MM (BLADE) ×1
BLADE AVERAGE 25X9 (BLADE) ×2 IMPLANT
BLADE SURG 10 STRL SS (BLADE) ×2 IMPLANT
BLADE SURG ROTATE 9660 (MISCELLANEOUS) ×3 IMPLANT
BNDG CMPR 9X4 STRL LF SNTH (GAUZE/BANDAGES/DRESSINGS) ×1
BNDG COHESIVE 4X5 TAN STRL (GAUZE/BANDAGES/DRESSINGS) ×3 IMPLANT
BNDG ELASTIC 2X5.8 VLCR STR LF (GAUZE/BANDAGES/DRESSINGS) ×2 IMPLANT
BNDG ESMARK 4X9 LF (GAUZE/BANDAGES/DRESSINGS) ×3 IMPLANT
BNDG GAUZE ELAST 4 BULKY (GAUZE/BANDAGES/DRESSINGS) ×3 IMPLANT
BRUSH SCRUB DISP (MISCELLANEOUS) ×6 IMPLANT
CLEANER TIP ELECTROSURG 2X2 (MISCELLANEOUS) ×3 IMPLANT
CLOSURE WOUND 1/2 X4 (GAUZE/BANDAGES/DRESSINGS)
COVER SURGICAL LIGHT HANDLE (MISCELLANEOUS) ×6 IMPLANT
CUFF TOURNIQUET SINGLE 18IN (TOURNIQUET CUFF) ×2 IMPLANT
CUFF TOURNIQUET SINGLE 24IN (TOURNIQUET CUFF) IMPLANT
DECANTER SPIKE VIAL GLASS SM (MISCELLANEOUS) IMPLANT
DRAPE C-ARM 42X72 X-RAY (DRAPES) ×2 IMPLANT
DRAPE C-ARMOR (DRAPES) ×3 IMPLANT
DRAPE INCISE IOBAN 66X45 STRL (DRAPES) IMPLANT
DRAPE U-SHAPE 47X51 STRL (DRAPES) ×3 IMPLANT
DRSG ADAPTIC 3X8 NADH LF (GAUZE/BANDAGES/DRESSINGS) IMPLANT
DRSG EMULSION OIL 3X3 NADH (GAUZE/BANDAGES/DRESSINGS) IMPLANT
DRSG MEPITEL 4X7.2 (GAUZE/BANDAGES/DRESSINGS) ×2 IMPLANT
ELECT REM PT RETURN 9FT ADLT (ELECTROSURGICAL) ×3
ELECTRODE REM PT RTRN 9FT ADLT (ELECTROSURGICAL) ×1 IMPLANT
FACESHIELD WRAPAROUND (MASK) IMPLANT
FACESHIELD WRAPAROUND OR TEAM (MASK) IMPLANT
GAUZE SPONGE 4X4 12PLY STRL (GAUZE/BANDAGES/DRESSINGS) ×3 IMPLANT
GAUZE XEROFORM 1X8 LF (GAUZE/BANDAGES/DRESSINGS) ×3 IMPLANT
GAUZE XEROFORM 5X9 LF (GAUZE/BANDAGES/DRESSINGS) ×3 IMPLANT
GLOVE BIO SURGEON STRL SZ7.5 (GLOVE) ×3 IMPLANT
GLOVE BIO SURGEON STRL SZ8 (GLOVE) ×3 IMPLANT
GLOVE BIOGEL PI IND STRL 7.5 (GLOVE) ×1 IMPLANT
GLOVE BIOGEL PI IND STRL 8 (GLOVE) ×1 IMPLANT
GLOVE BIOGEL PI INDICATOR 7.5 (GLOVE) ×2
GLOVE BIOGEL PI INDICATOR 8 (GLOVE) ×2
GOWN STRL REUS W/ TWL LRG LVL3 (GOWN DISPOSABLE) ×2 IMPLANT
GOWN STRL REUS W/ TWL XL LVL3 (GOWN DISPOSABLE) ×1 IMPLANT
GOWN STRL REUS W/TWL LRG LVL3 (GOWN DISPOSABLE) ×6
GOWN STRL REUS W/TWL XL LVL3 (GOWN DISPOSABLE) ×3
KIT BASIN OR (CUSTOM PROCEDURE TRAY) ×3 IMPLANT
KIT ROOM TURNOVER OR (KITS) ×3 IMPLANT
MANIFOLD NEPTUNE II (INSTRUMENTS) ×3 IMPLANT
NEEDLE MAYO 1/2 CIRCLE (NEEDLE) ×2 IMPLANT
NS IRRIG 1000ML POUR BTL (IV SOLUTION) ×3 IMPLANT
PACK ORTHO EXTREMITY (CUSTOM PROCEDURE TRAY) ×3 IMPLANT
PAD ABD 8X10 STRL (GAUZE/BANDAGES/DRESSINGS) ×4 IMPLANT
PAD ARMBOARD 7.5X6 YLW CONV (MISCELLANEOUS) ×6 IMPLANT
PAD CAST 3X4 CTTN HI CHSV (CAST SUPPLIES) IMPLANT
PAD CAST 4YDX4 CTTN HI CHSV (CAST SUPPLIES) ×1 IMPLANT
PADDING CAST COTTON 2X4 NS (CAST SUPPLIES) ×2 IMPLANT
PADDING CAST COTTON 3X4 STRL (CAST SUPPLIES) ×6
PADDING CAST COTTON 4X4 STRL (CAST SUPPLIES) ×3
SLIDE-LOC ARH NECK 1MM (Miscellaneous) ×2 IMPLANT
SLIDE-LOC STEM STND 10MM (Stem) ×2 IMPLANT
SPLINT PLASTER CAST XFAST 5X30 (CAST SUPPLIES) IMPLANT
SPLINT PLASTER XFAST SET 5X30 (CAST SUPPLIES) ×2
SPONGE LAP 18X18 X RAY DECT (DISPOSABLE) ×6 IMPLANT
SPONGE SCRUB IODOPHOR (GAUZE/BANDAGES/DRESSINGS) ×3 IMPLANT
STAPLER VISISTAT 35W (STAPLE) ×3 IMPLANT
STOCKINETTE IMPERVIOUS 9X36 MD (GAUZE/BANDAGES/DRESSINGS) ×2 IMPLANT
STRIP CLOSURE SKIN 1/2X4 (GAUZE/BANDAGES/DRESSINGS) IMPLANT
SUCTION FRAZIER TIP 10 FR DISP (SUCTIONS) ×3 IMPLANT
SUT ETHILON 3 0 PS 1 (SUTURE) ×2 IMPLANT
SUT PDS AB 2-0 CT1 27 (SUTURE) IMPLANT
SUT PROLENE 3 0 PS 2 (SUTURE) ×6 IMPLANT
SUT VIC AB 0 CT1 27 (SUTURE) ×6
SUT VIC AB 0 CT1 27XBRD ANBCTR (SUTURE) ×2 IMPLANT
SUT VIC AB 2-0 CT1 27 (SUTURE) ×6
SUT VIC AB 2-0 CT1 TAPERPNT 27 (SUTURE) ×2 IMPLANT
SUT VIC AB 2-0 CT3 27 (SUTURE) IMPLANT
SYR CONTROL 10ML LL (SYRINGE) ×3 IMPLANT
TOWEL OR 17X24 6PK STRL BLUE (TOWEL DISPOSABLE) ×4 IMPLANT
TOWEL OR 17X26 10 PK STRL BLUE (TOWEL DISPOSABLE) ×6 IMPLANT
TUBE CONNECTING 12'X1/4 (SUCTIONS) ×1
TUBE CONNECTING 12X1/4 (SUCTIONS) ×2 IMPLANT
UNDERPAD 30X30 INCONTINENT (UNDERPADS AND DIAPERS) ×3 IMPLANT
WATER STERILE IRR 1000ML POUR (IV SOLUTION) ×3 IMPLANT
YANKAUER SUCT BULB TIP NO VENT (SUCTIONS) ×3 IMPLANT

## 2015-06-25 NOTE — Progress Notes (Signed)
   06/25/15 0919  OBSTRUCTIVE SLEEP APNEA  Have you ever been diagnosed with sleep apnea through a sleep study? No  Do you snore loudly (loud enough to be heard through closed doors)?  1  Do you often feel tired, fatigued, or sleepy during the daytime (such as falling asleep during driving or talking to someone)? 0  Has anyone observed you stop breathing during your sleep? 1  Do you have, or are you being treated for high blood pressure? 1  BMI more than 35 kg/m2? 1  Age > 50 (1-yes) 1  Neck circumference greater than:Male 16 inches or larger, Male 17inches or larger? 1  Male Gender (Yes=1) 1  Obstructive Sleep Apnea Score 7  Score 5 or greater  Results sent to PCP

## 2015-06-25 NOTE — Progress Notes (Signed)
Pt arrived to 6N21 via bed from PACU.  Tried to call wife but did not get.  Pt attempting to urinate in the urinal.  SCDs ordered.

## 2015-06-25 NOTE — Progress Notes (Signed)
Lab work noted in Care Everywhere per Dr. Carola Frost no need to repeat. Dr. Carola Frost requested CT scan of elbow patient has results in Care Everywhere per CT scan we do not have a way to get images. Per Dr. Carola Frost will need to repeat CT scan so that he can see the images.

## 2015-06-25 NOTE — Anesthesia Procedure Notes (Addendum)
Anesthesia Regional Block:  Supraclavicular block  Pre-Anesthetic Checklist: ,, timeout performed, Correct Patient, Correct Site, Correct Laterality, Correct Procedure, Correct Position, site marked, Risks and benefits discussed,  Surgical consent,  Pre-op evaluation,  At surgeon's request and post-op pain management  Laterality: Left and Upper  Prep: chloraprep       Needles:   Needle Type: Echogenic Stimulator Needle     Needle Length: 9cm 9 cm Needle Gauge: 22 and 22 G  Needle insertion depth: 6 cm   Additional Needles:  Procedures: ultrasound guided (picture in chart) and nerve stimulator Supraclavicular block Narrative:  Start time: 06/25/2015 10:55 AM End time: 06/25/2015 11:10 AM Injection made incrementally with aspirations every 5 mL.  Performed by: Personally  Anesthesiologist: MASSAGEE, TERRY  Additional Notes: tolerated well   Procedure Name: Intubation Date/Time: 06/25/2015 11:25 AM Performed by: Marena Chancy Pre-anesthesia Checklist: Patient identified, Emergency Drugs available, Suction available, Patient being monitored and Timeout performed Patient Re-evaluated:Patient Re-evaluated prior to inductionOxygen Delivery Method: Circle system utilized Preoxygenation: Pre-oxygenation with 100% oxygen Intubation Type: IV induction Laryngoscope Size: Miller and 3 Grade View: Grade II Tube type: Oral Tube size: 8.0 mm Number of attempts: 1 Placement Confirmation: positive ETCO2,  ETT inserted through vocal cords under direct vision and breath sounds checked- equal and bilateral Tube secured with: Tape Dental Injury: Teeth and Oropharynx as per pre-operative assessment

## 2015-06-25 NOTE — Anesthesia Preprocedure Evaluation (Signed)
Anesthesia Evaluation  Patient identified by MRN, date of birth, ID band Patient awake    Reviewed: Allergy & Precautions, NPO status   History of Anesthesia Complications Negative for: history of anesthetic complications  Airway Mallampati: I  TM Distance: >3 FB Neck ROM: Full    Dental  (+) Teeth Intact   Pulmonary neg pulmonary ROS,    breath sounds clear to auscultation       Cardiovascular hypertension,  Rhythm:Regular Rate:Normal     Neuro/Psych negative neurological ROS     GI/Hepatic negative GI ROS, Neg liver ROS,   Endo/Other  Morbid obesity  Renal/GU negative Renal ROS     Musculoskeletal   Abdominal (+) + obese,   Peds  Hematology negative hematology ROS (+)   Anesthesia Other Findings   Reproductive/Obstetrics                             Anesthesia Physical Anesthesia Plan  ASA: II  Anesthesia Plan: General   Post-op Pain Management: GA combined w/ Regional for post-op pain   Induction: Intravenous  Airway Management Planned: Oral ETT  Additional Equipment:   Intra-op Plan:   Post-operative Plan: Extubation in OR  Informed Consent:   Dental advisory given  Plan Discussed with: CRNA and Surgeon  Anesthesia Plan Comments:         Anesthesia Quick Evaluation

## 2015-06-25 NOTE — H&P (Signed)
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  Presented to Dr. Lorin Picket Ortega's office for evaluation and plans were made for treatment with trauma or hand surgeon out of town.  However, patient had such severe pain that he chose to come to ED for relief and treatment this am.  Fast tracked. At outside facility did undergo ECHO showing 55-60% EF, clear carotids on u/s.   Past Medical History  Diagnosis Date  . Hypertension   . Allergy     rhinitis  . TIA (transient ischemic attack)     Past Surgical History  Procedure Laterality Date  . Laparoscopic cholecystectomy  07    Tsuri    Family History  Problem Relation Age of Onset  . Anemia Sister    Social History:  reports that he has never smoked. He has never used smokeless tobacco. He reports that he drinks alcohol. He reports that he does not use illicit drugs.  Allergies: No Known Allergies  Facility-administered medications prior to admission  Medication Dose Route Frequency Provider Last Rate Last Dose  . ipratropium-albuterol (DUONEB) 0.5-2.5 (3) MG/3ML nebulizer solution 3 mL  3 mL Nebulization Once Phillip Canavan, PA-C       Medications Prior to Admission  Medication Sig Dispense Refill  . amLODipine (NORVASC) 5 MG tablet Take 5 mg by mouth daily.    Marland Kitchen aspirin EC 325 MG tablet Take 325 mg by mouth daily.    Marland Kitchen buPROPion (WELLBUTRIN) 100 MG tablet Take 1 tablet (100 mg total) by mouth 1 day or 1 dose. 30 tablet 1  . carvedilol (COREG) 6.25 MG tablet Take 6.25 mg by mouth 2 (two) times daily with a meal.    . cetirizine (ZYRTEC) 10 MG tablet Take 1 tablet (10 mg total) by mouth daily. (Patient taking differently: Take 10 mg by mouth daily as needed for allergies. ) 30 tablet 2  . FLUoxetine (PROZAC) 20 MG tablet TAKE 1 TABLET BY  MOUTH EVERY DAY 30 tablet 0  . fluticasone (FLONASE) 50 MCG/ACT nasal spray Place 2 sprays into the nose daily. (Patient taking differently: Place 2 sprays into the nose daily as needed for allergies. ) 16 g 5  . Oxycodone HCl 10 MG TABS Take 10 mg by mouth every 4 (four) hours.      No results found for this or any previous visit (from the past 48 hour(s)). No results found.  ROS No recent fever, bleeding abnormalities, urologic dysfunction, GI problems other than constipation assoc with narcotics, or weight gain.  Blood pressure 146/98, pulse 80, temperature 98.4 F (36.9 C), temperature source Oral, resp. rate 16, height  (1.854 m), weight 284 lb (128.822 kg), SpO2 97 %. Physical Exam Grimacing in pain [name]/AT RRR CTA bilat S/NT/ND LUEx shoulder, digits- no skin wounds, nontender, no instability, no blocks to motion  Splint in place  Sens  Ax/R/M/U intact  Mot   Ax/ R/ PIN/ M/ AIN/ U intact  Rad 2+   Assessment/Plan Needs immediate reduction and repair  I discussed with the patient and his wife the risks and benefits of surgery, including the possibility of infection, recurrent instability, nerve injury, vessel injury, wound breakdown, arthritis, symptomatic hardware, DVT/ PE, loss of motion, and need for further surgery among others.  He understood these risks and wished to  proceed.    Phillip Galas, MD [facility] [phone] (p)   06/25/2015, 10:39 AM

## 2015-06-25 NOTE — Op Note (Signed)
NAME:  Phillip Ortega, Phillip Ortega                  ACCOUNT NO.:  [id number]  MEDICAL RECORD NO.:  [id number]  LOCATION:  6N21C                        FACILITY:  [facility]  PHYSICIAN:  Doralee Albino. Carola Frost, M.D. DATE OF BIRTH:  [birth year]  DATE OF PROCEDURE:  06/25/2015 DATE OF DISCHARGE:                              OPERATIVE REPORT   PREOPERATIVE DIAGNOSIS:  Left elbow fracture dislocation.  POSTOPERATIVE DIAGNOSES: 1. Subacute left elbow posterior dislocation. 2. Radial head and neck fractures involving about 40% of the articular     surface. 3. Avulsion fractures off the medial trochlea. 4. Complete capsular avulsion.  PROCEDURES: 1. Open treatment of left elbow dislocation with repair of lateral     collateral ligament. 2. Open treatment of left radial head fracture with radial head     arthroplasty using Acumed #10 stem, 22 mm head, 10 mm height. 3. Removal of multiple bone fragments from left elbow joint including     off the medial trochlea. 4. Stress fluoroscopy of the left elbow.  SURGEON:  Doralee Albino. Carola Frost, M.D.  ASSISTANT:  Phillip Morita, Phillip Ortega.  ANESTHESIA:  General.  COMPLICATIONS:  None.  TOURNIQUET:  At 2 hours and 1 minute.  DISPOSITION:  To PACU.  CONDITION:  Stable.  BRIEF SUMMARY AND INDICATION FOR PROCEDURE:  Phillip Ortega is a right-hand dominant 55 year old male who sustained a fracture dislocation of the left elbow while attending to events surrounding his mother's funeral. He was treated with closed reduction, but developed a recurrent dislocation.  He saw one of the orthopedic surgeons in town yesterday and plans were made for followup treatment, however, overnight his pain became progressively severe and he presented to the emergency department with uncontrolled pain secondary to his persistent dislocation.  I did discuss with him and his wife the risks and benefits of reduction and repair including the very high likelihood of recurrent instability. Also, we discussed  post interosseous nerve injury, infection, arthritis, and need for further procedures in addition to heart attack, stroke, infection, or neurovascular injury.  The patient acknowledged these risks and did wish to proceed.  We also did discuss the high likelihood of loss of some range of motion.  BRIEF SUMMARY OF PROCEDURE:  Phillip Ortega received preoperative antibiotics as well as a regional block.  He was taken to the operating room where general anesthesia was induced.  A Kocher incision was made for approach to the radial capitellar joint protecting the nerve with pronation of the forearm.  After dissection through the fascia, the complete disruption of the capsule was evident with absolutely no soft tissue attachments to the entire lateral aspect of the distal humerus anteriorly or posteriorly.  The radial head fragment was grasped and then the joint distracted to allow for complete evaluation.  There were severe posttraumatic chondral injuries to the posterior medial distal humerus and also some other scattered osteochondral changes including some cartilage injury to the tip of the coronoid.  The medial ulnar fracture was a miniscule osteochondral fragment and was  irrigated and removed.  The radial neck was then cut with a microsagittal saw.  As we removed, we identified additional fracture in the neck area where there  had been impaction that was distinct from the missing 40% of the radial head.  The canal was prepared, sized, and taken through trial components with placement of a 10 mm high Acumed 22 mm in circumference head and a #10 stem.  This gave [place] excellent rotational control.  The rotational device was matched with Lister's tubercle and there was much improved stability with placement of the implant.  This was then followed with repair of the lateral collateral ligament and capsule to restore stability after the dislocation.  We used 2 JuggerKnot anchors in the lateral  epicondyle after roughening the surface back to bleeding bone using a rongeur and rasp.  A soft tissue confluence representing the lateral ulnar collateral ligament was then brought up and imbricated directly to the bone and this was followed by imbrication of the capsule initially posteriorly and then also joined this with the anterior capsule.  Once more C-arm was brought in for an additional images confirming appropriate reduction instability out to 90 degrees and beyond where I was careful to evaluate this under fluoro, repeatedly to make sure that we had a concentric reduction with good stability.  The forearm was kept in pronation to assist with stabilization.  The muscle layer was imbricated with #2 FiberWire and then 0 Vicryl, 2-0 Vicryl, and nylon used for final closure.  Sterile gently compressive dressing was applied with the patient in 90 degrees of flexion and pronation. Phillip Morita, Phillip Ortega assisted me throughout and his assistance as well as that of another was necessary for exposure, preparation of the radial head, repair of the lateral ulnar collateral ligament and tourniquet was deflated at 2 hours and 1 minute.  There were no complications.  The patient was taken to PACU in stable condition.  PROGNOSIS:  Phillip Ortega is at high risk for recurrent instability given his large body habitus and BMI of 37. We will keep him immobilized in 90 degrees for the next 2 weeks. removing his sutures changing his dressing at that time, and then he will go into a hinged elbow brace most likely with more guarded and slower return to motion.  Furthermore, his extensive cartilage injury identified intraoperatively increases his chance of symptomatic arthritis and further loss of motion in addition to the slow rehab.     Doralee Albino. Carola Frost, M.D.     [facility]/MEDQ  D:  06/25/2015  T:  06/25/2015  Job:  [id number]

## 2015-06-25 NOTE — Progress Notes (Signed)
Pt alert and oriented s/p left elbow dislocation repair of lateral collateral ligament, did regional blocking, pt left arm with soft cast compression with sling, warm to touch, nail capillary <2 sec, unable to move his fingers, numb, Dr. Carola Frost Ortho informed and said he cannot move his fingers until tomorrow, will continue to  monitor, charge nurse informed.

## 2015-06-25 NOTE — Transfer of Care (Signed)
Immediate Anesthesia Transfer of Care Note  Patient: Phillip Ortega  Procedure(s) Performed: Procedure(s): OPEN REDUCTION INTERNAL FIXATION (ORIF) ELBOW/OLECRANON FRACTURE RADIO HEAD ARTHROPLASTY (Left)  Patient Location: PACU  Anesthesia Type:GA combined with regional for post-op pain  Level of Consciousness: awake, alert  and oriented  Airway & Oxygen Therapy: Patient Spontanous Breathing and Patient connected to nasal cannula oxygen  Post-op Assessment: Report given to RN and Post -op Vital signs reviewed and stable  Post vital signs: Reviewed and stable  Last Vitals:  Filed Vitals:   06/25/15 0911 06/25/15 1031  BP: 140/94 146/98  Pulse: 74 80  Temp: 36.9 C   Resp: 16     Complications: No apparent anesthesia complications

## 2015-06-25 NOTE — Brief Op Note (Signed)
Procedures: 1. Open repair of left elbow dislocation 2. Left radial head arthroplasty for fracture, Acumed 10 stem, 22x46mm head 3. Removal of multiple fragments from joint including medial ulna fracture 4. Stress flouro   Anesth: General plus block  Surg: Joelle Flessner AsstRenae Fickle  [phone]

## 2015-06-25 NOTE — Anesthesia Postprocedure Evaluation (Signed)
Anesthesia Post Note  Patient: Phillip Ortega  Procedure(s) Performed: Procedure(s) (LRB): OPEN REDUCTION INTERNAL FIXATION (ORIF) ELBOW/OLECRANON FRACTURE RADIO HEAD ARTHROPLASTY (Left)  Patient location during evaluation: PACU Anesthesia Type: General and Regional Level of consciousness: awake and alert Pain management: satisfactory to patient Vital Signs Assessment: post-procedure vital signs reviewed and stable Respiratory status: spontaneous breathing, nonlabored ventilation, respiratory function stable and patient connected to nasal cannula oxygen Cardiovascular status: blood pressure returned to baseline and stable Postop Assessment: no signs of nausea or vomiting Anesthetic complications: no    Last Vitals:  Filed Vitals:   06/25/15 1515 06/25/15 1530  BP:    Pulse: 69 67  Temp:    Resp: 15 15    Last Pain:  Filed Vitals:   06/25/15 1540  PainSc: 0-No pain                 Quin Mcpherson,JAMES TERRILL

## 2015-06-26 LAB — BASIC METABOLIC PANEL
ANION GAP: 7 (ref 5–15)
BUN: 13 mg/dL (ref 6–20)
CO2: 25 mmol/L (ref 22–32)
Calcium: 8.1 mg/dL — ABNORMAL LOW (ref 8.9–10.3)
Chloride: 106 mmol/L (ref 101–111)
Creatinine, Ser: 0.92 mg/dL (ref 0.61–1.24)
GFR calc Af Amer: >60 mL/min (ref >60)
GFR calc non Af Amer: >60 mL/min (ref >60)
GLUCOSE: 113 mg/dL — AB (ref 65–99)
POTASSIUM: 4.1 mmol/L (ref 3.5–5.1)
Sodium: 138 mmol/L (ref 135–145)

## 2015-06-26 LAB — CBC
HEMATOCRIT: 38.6 % — AB (ref 39.0–52.0)
HEMOGLOBIN: 12.7 g/dL — AB (ref 13.0–17.0)
MCH: 28.7 pg (ref 26.0–34.0)
MCHC: 32.9 g/dL (ref 30.0–36.0)
MCV: 87.1 fL (ref 78.0–100.0)
Platelets: 248 10*3/uL (ref 150–400)
RBC: 4.43 MIL/uL (ref 4.22–5.81)
RDW: 13.7 % (ref 11.5–15.5)
WBC: 12.9 10*3/uL — ABNORMAL HIGH (ref 4.0–10.5)

## 2015-06-26 NOTE — Evaluation (Signed)
Occupational Therapy Evaluation Patient Details Name: Phillip Ortega MRN: [id number] DOB: 1960/09/17 Today's Date: 06/26/2015    History of Present Illness [name] is a 55 yo male admitted 06/25/15 with Lt elbow fx/dislocation.  Now s/p repair elbow dislocation and radial head arthroplasty Lt.   PMH:  HTN, TIA   Clinical Impression   Pt. Was lethargic but easily aroused during therapy. Pt. Was have significant pain in L UE but was willing to work with therapy. Pt. Was premedicated and nurse was aware that pt. Was still complaining of pain. Pt. Was ed on performing L UE SHLD and digit AROM but pain limited performance. Pt. Was ed on bed mobility and transfers to increase safety with pt. Lethargic, impulsive, and "woozy" pt. Was CGA with task. Pt. Was ed on ADL strategies but will need to be reinforced secondary to pt. Kept falling asleep during session. Pt. Plans to d/c home with family assist.      Follow Up Recommendations  Home health OT    Equipment Recommendations  None recommended by OT    Recommendations for Other Services       Precautions / Restrictions Precautions Precautions: Fall Precaution Comments: Per MD notes pt. to have L elbow immibilzed at 90 degrees for 2 weeks. Required Braces or Orthoses: Sling Restrictions Weight Bearing Restrictions: Yes LUE Weight Bearing: Non weight bearing Other Position/Activity Restrictions: L elbow at 90 degrees for 2 weeks.      Mobility Bed Mobility               General bed mobility comments: Patient sitting EOB  Transfers Overall transfer level: Needs assistance Equipment used: None Transfers: Sit to/from [employer] Transfers Sit to Stand: Min guard Stand pivot transfers: Min guard       General transfer comment:  (Min guard assist.)    Balance Overall balance assessment: Needs assistance         Standing balance support: No upper extremity supported Standing balance-Leahy Scale: Good Standing  balance comment: Slightly unsteady with gait.                            ADL Overall ADL's : Needs assistance/impaired Eating/Feeding: Set up Eating/Feeding Details (indicate cue type and reason):  (assist to cut food and open containers. ) Grooming: Wash/dry hands;Wash/dry face;Brushing hair;Minimal assistance   Upper Body Bathing: Moderate assistance   Lower Body Bathing: Moderate assistance   Upper Body Dressing : Maximal assistance   Lower Body Dressing: Maximal assistance   Toilet Transfer: Min guard   Toileting- Clothing Manipulation and Hygiene: Minimal assistance       Functional mobility during ADLs: Min guard General ADL Comments:  (Pt. is reporting he feels woozy with EOB/OOB activities. )     Vision     Perception     Praxis      Pertinent Vitals/Pain Pain Assessment: 0-10 Pain Score: 10-Worst pain ever Pain Location:  (L elbow) Pain Descriptors / Indicators: Burning;Constant Pain Intervention(s): Limited activity within patient's tolerance;Premedicated before session     Hand Dominance     Extremity/Trunk Assessment Upper Extremity Assessment Upper Extremity Assessment: LUE deficits/detail LUE Deficits / Details: L elbow cast and sling LUE: Unable to fully assess due to pain;Unable to fully assess due to immobilization LUE Sensation: decreased light touch LUE Coordination: decreased fine motor   Lower Extremity Assessment Lower Extremity Assessment: Overall WFL for tasks assessed   Cervical / Trunk Assessment Cervical /  Trunk Assessment: Normal   Communication Communication Communication: No difficulties   Cognition Arousal/Alertness: Lethargic;Suspect due to medications Behavior During Therapy: [facility] for tasks assessed/performed Overall Cognitive Status: Within Functional Limits for tasks assessed                     General Comments       Exercises       Shoulder Instructions      Home Living Family/patient  expects to be discharged to:: Private residence Living Arrangements: Spouse/significant other;Children Available Help at Discharge: Family;Available 24 hours/day Type of Home: House Home Access: Stairs to enter [employer] of Steps: 4 Entrance Stairs-Rails: None Home Layout: One level     Bathroom Shower/Tub: IT trainer: Standard     Home Equipment: None          Prior Functioning/Environment Level of Independence: Independent        Comments: Works full time - computers    OT Diagnosis: Generalized weakness;Acute pain   OT Problem List: Decreased strength;Decreased range of motion;Decreased activity tolerance;Decreased coordination;Decreased safety awareness;Decreased knowledge of use of DME or AE;Impaired sensation;Impaired UE functional use;Pain   OT Treatment/Interventions: Self-care/ADL training;Therapeutic exercise;DME and/or AE instruction;Therapeutic activities;Patient/family education    OT Goals(Current goals can be found in the care plan section) Acute Rehab OT Goals Patient Stated Goal: to go home OT Goal Formulation: With patient Time For Goal Achievement: 07/10/15 Potential to Achieve Goals: Good ADL Goals Pt Will Perform Grooming: Independently;standing Pt Will Perform Upper Body Bathing: with modified independence;sitting Pt Will Perform Lower Body Bathing: Independently;sit to/from stand Pt Will Perform Upper Body Dressing: sitting;with modified independence Pt Will Perform Lower Body Dressing: with modified independence;sit to/from stand Pt Will Transfer to Toilet: with modified independence;regular height toilet Pt Will Perform Toileting - Clothing Manipulation and hygiene: with modified independence;sit to/from stand Pt Will Perform Tub/Shower Transfer: Tub transfer;with modified independence;ambulating Pt/caregiver will Perform Home Exercise Program: Left upper extremity;Increased ROM;Independently  OT  Frequency: Min 3X/week   Barriers to D/C:            Co-evaluation              End of Session Equipment Utilized During Treatment: Other (comment) Nurse Communication: Other (comment)  Activity Tolerance: Patient limited by lethargy;Patient limited by pain Patient left: in chair;with call bell/phone within reach   Time: 1010-1110 OT Time Calculation (min): 60 min Charges:  OT General Charges $OT Visit: 1 Procedure OT Evaluation $OT Eval Moderate Complexity: 1 Procedure OT Treatments $Self Care/Home Management : 8-22 mins $Therapeutic Exercise: 8-22 mins G-Codes:    Trai Ells 07-25-15, 11:29 AM

## 2015-06-26 NOTE — Progress Notes (Signed)
Orthopedic Tech Progress Note Patient Details:  Phillip Ortega 22-Sep-1960 [id number]  Ortho Devices Ortho Device/Splint Location: applied ohf to bed Ortho Device/Splint Interventions: Ordered, Application   Jennye Moccasin 06/26/2015, 4:38 PM

## 2015-06-26 NOTE — Evaluation (Signed)
Physical Therapy Evaluation Patient Details Name: Phillip Ortega MRN: [id number] DOB: [birth year] Today's Date: 06/26/2015   History of Present Illness  [name] is a 55 yo male admitted 06/25/15 with Lt elbow fx/dislocation.  Now s/p repair elbow dislocation and radial head arthroplasty Lt.   PMH:  HTN, TIA  Clinical Impression  Patient presents with problems listed below.  Will benefit from acute PT to maximize functional mobility prior to discharge home with family.  Do not anticipate any f/u PT needs at d/c.    Follow Up Recommendations No PT follow up;Supervision/Assistance - 24 hour    Equipment Recommendations  None recommended by PT    Recommendations for Other Services       Precautions / Restrictions Precautions Precautions: Fall Required Braces or Orthoses: Sling Restrictions Weight Bearing Restrictions: Yes LUE Weight Bearing: Non weight bearing      Mobility  Bed Mobility               General bed mobility comments: Patient sitting EOB  Transfers Overall transfer level: Needs assistance Equipment used: None Transfers: Sit to/from Stand Sit to Stand: Min guard         General transfer comment: Min guard for safety.  Good balance in stance.  Ambulation/Gait Ambulation/Gait assistance: Min guard Ambulation Distance (Feet): 120 Feet Assistive device: None Gait Pattern/deviations: Step-through pattern;Decreased stride length Gait velocity: decreased Gait velocity interpretation: Below normal speed for age/gender General Gait Details: Patient with slow gait pattern.  Slightly unsteady.  Holds LUE with RUE.  Encouraged to allow RUE to remain by his side.  Stairs            Wheelchair Mobility    Modified Rankin (Stroke Patients Only)       Balance Overall balance assessment: Needs assistance         Standing balance support: No upper extremity supported Standing balance-Leahy Scale: Good Standing balance comment: Slightly unsteady with  gait.                             Pertinent Vitals/Pain Pain Assessment: 0-10 Pain Score: 8  Pain Location: Lt elbow Pain Descriptors / Indicators: Aching;Sore Pain Intervention(s): Limited activity within patient's tolerance;Monitored during session;Repositioned    Home Living Family/patient expects to be discharged to:: Private residence Living Arrangements: Spouse/significant other;Children Available Help at Discharge: Family;Available 24 hours/day Type of Home: House Home Access: Stairs to enter Entrance Stairs-Rails: None Entrance Stairs-Number of Steps: 4 Home Layout: One level Home Equipment: None      Prior Function Level of Independence: Independent         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  See OT evaluation   Lower Extremity Assessment: Overall WFL for tasks assessed      Cervical / Trunk Assessment: Normal  Communication   Communication: No difficulties  Cognition Arousal/Alertness: Lethargic;Suspect due to medications Behavior During Therapy: [facility] for tasks assessed/performed Overall Cognitive Status: Within Functional Limits for tasks assessed                      General Comments      Exercises        Assessment/Plan    PT Assessment Patient needs continued  PT services  PT Diagnosis Abnormality of gait;Acute pain   PT Problem List Decreased strength;Decreased range of motion;Decreased activity tolerance;Decreased balance;Decreased mobility;Decreased knowledge of precautions;Pain  PT Treatment Interventions Gait training;Stair training;Functional mobility training;Therapeutic activities;Patient/family education   PT Goals (Current goals can be found in the Care Plan section) Acute Rehab PT Goals Patient Stated Goal: To return home PT  Goal Formulation: With patient Time For Goal Achievement: 07/03/15 Potential to Achieve Goals: Good    Frequency Min 3X/week   Barriers to discharge        Co-evaluation               End of Session Equipment Utilized During Treatment: Other (comment) (Sling) Activity Tolerance: Patient limited by lethargy;Patient limited by pain Patient left: in bed;with call bell/phone within reach;with bed alarm set (sitting EOB for breakfast)           Time: 1610-9604 PT Time Calculation (min) (ACUTE ONLY): 15 min   Charges:   PT Evaluation $PT Eval Moderate Complexity: 1 Procedure     PT G CodesVena Ortega 29-Jun-2015, 9:24 AM Phillip Ortega. Phillip Ortega, [facility] Acute Rehab Services Pager [phone]

## 2015-06-26 NOTE — Progress Notes (Signed)
Orthopaedic Trauma Service (OTS)   Subjective: Patient reports pain as moderate.   Feeling has returned to digits.  Objective: Temp:  [97.5 F (36.4 C)-98.3 F (36.8 C)] 97.7 F (36.5 C) (01/22 0531) Pulse Rate:  [60-77] 72 (01/22 0531) Resp:  [14-18] 18 (01/22 0531) BP: (105-128)/(62-85) 128/76 mmHg (01/22 0531) SpO2:  [93 %-100 %] 93 % (01/22 0531) Weight:  [285 lb 4.4 oz (129.4 kg)] 285 lb 4.4 oz (129.4 kg) (01/21 1627) Physical Exam LUEx   Splint in place  Sens  Ax/R/M/U intact  Mot   Ax/ R/ PIN/ M/ AIN/ U intact  Brisk CR Xrays elbow reduced   Assessment/Plan: 1 Day Post-Op Procedure(s) (LRB): OPEN REDUCTION INTERNAL FIXATION (ORIF) ELBOW/OLECRANON FRACTURE RADIO HEAD ARTHROPLASTY (Left) D/c planning for tomorrow OT tomorrow Wean to po meds  Myrene Galas, MD [facility] [phone] (p)

## 2015-06-27 ENCOUNTER — Encounter (HOSPITAL_COMMUNITY): Payer: Self-pay | Admitting: Orthopedic Surgery

## 2015-06-27 DIAGNOSIS — S53105A Unspecified dislocation of left ulnohumeral joint, initial encounter: Secondary | ICD-10-CM | POA: Diagnosis present

## 2015-06-27 HISTORY — DX: Unspecified dislocation of left ulnohumeral joint, initial encounter: S53.105A

## 2015-06-27 LAB — BASIC METABOLIC PANEL
ANION GAP: 10 (ref 5–15)
BUN: 9 mg/dL (ref 6–20)
CALCIUM: 8.3 mg/dL — AB (ref 8.9–10.3)
CO2: 23 mmol/L (ref 22–32)
Chloride: 102 mmol/L (ref 101–111)
Creatinine, Ser: 0.77 mg/dL (ref 0.61–1.24)
Glucose, Bld: 110 mg/dL — ABNORMAL HIGH (ref 65–99)
POTASSIUM: 4.3 mmol/L (ref 3.5–5.1)
SODIUM: 135 mmol/L (ref 135–145)

## 2015-06-27 MED ORDER — OXYCODONE-ACETAMINOPHEN 5-325 MG PO TABS: 1.0000 | ORAL_TABLET | Freq: Four times a day (QID) | ORAL | Status: DC | PRN

## 2015-06-27 MED ORDER — OXYCODONE HCL 5 MG PO TABS: 5.0000 mg | ORAL_TABLET | Freq: Four times a day (QID) | ORAL | Status: DC | PRN

## 2015-06-27 MED ORDER — DOCUSATE SODIUM 100 MG PO CAPS: 100.0000 mg | ORAL_CAPSULE | Freq: Two times a day (BID) | ORAL | Status: DC

## 2015-06-27 MED ORDER — METHOCARBAMOL 500 MG PO TABS: 1000.0000 mg | ORAL_TABLET | Freq: Four times a day (QID) | ORAL | Status: DC

## 2015-06-27 NOTE — Discharge Summary (Signed)
Orthopaedic Trauma Service (OTS)  Patient ID: ELSTER CORBELLO MRN: [id number] DOB/AGE: 1960-12-02 55 y.o.  Admit date: 06/25/2015 Discharge date: 06/27/2015  Admission Diagnoses: Left radial head Left elbow dislocation  Depression Anxiety Hypertension Obesity   Discharge Diagnoses:  Principal Problem:   Left radial head fracture Active Problems:   Depression   Generalized anxiety disorder   Hypertension   Obesity   Dislocation of left elbow   Procedures Performed:  06/25/2015- Dr. Marcelino Scot  1. Open treatment of left elbow dislocation with repair of lateral     collateral ligament. 2. Open treatment of left radial head fracture with radial head     arthroplasty using Acumed #10 stem, 22 mm head, 10 mm height. 3. Removal of multiple bone fragments from left elbow joint including     off the medial trochlea. 4. Stress fluoroscopy of the left elbow  Discharged Condition: good  Hospital Course:   55 year old right-hand-dominant male originally presented to Dr. Randel Pigg office late last week after sustaining a left elbow dislocation and radial head fracture out of town over a week ago. Arrangements were being made to refer the patient to the orthopedic traumatologist or to an of town Copy. Patient presented to the emergency department on the morning of 06/25/2015 with severe and uncontrolled pain. Patient was seen by our services were on call. He was taken to the OR later that morning for radial head arthroplasty of his left elbow as well as repair of his lateral collateral ligaments. Patient tolerated procedure well. After surgery he was transferred to the PACU for recovery from anesthesia and transferred to the medical surgical floor for observation, pain control and to use. Patient's hospital stay was uncomplicated. There were no perioperative issues. Patient progressed well in an expected fashion for his injury and surgery. I would several days patient worked with physical  therapy as well as occupational therapy and was mobilizing well. On postoperative day #2 his pain was well-controlled with oral pain medication. He was tolerating a regular diet and voiding without difficulty. Patient was deemed to be stable for discharge on 06/27/2015.  Consults: None  Significant Diagnostic Studies: labs:   Results for CAIGE, ALMEDA (MRN [id number]) as of 06/27/2015 09:13  Ref. Range 06/26/2015 04:54  Sodium Latest Ref Range: 135-145 mmol/L 138  Potassium Latest Ref Range: 3.5-5.1 mmol/L 4.1  Chloride Latest Ref Range: 101-111 mmol/L 106  CO2 Latest Ref Range: 22-32 mmol/L 25  BUN Latest Ref Range: 6-20 mg/dL 13  Creatinine Latest Ref Range: 0.61-1.24 mg/dL 0.92  Calcium Latest Ref Range: 8.9-10.3 mg/dL 8.1 (L)  EGFR (Non-African Amer.) Latest Ref Range: >60 mL/min >60  EGFR (African American) Latest Ref Range: >60 mL/min >60  Glucose Latest Ref Range: 65-99 mg/dL 113 (H)  Anion gap Latest Ref Range: 5-15  7  WBC Latest Ref Range: 4.0-10.5 K/uL 12.9 (H)  RBC Latest Ref Range: 4.22-5.81 MIL/uL 4.43  Hemoglobin Latest Ref Range: 13.0-17.0 g/dL 12.7 (L)  HCT Latest Ref Range: 39.0-52.0 % 38.6 (L)  MCV Latest Ref Range: 78.0-100.0 fL 87.1  MCH Latest Ref Range: 26.0-34.0 pg 28.7  MCHC Latest Ref Range: 30.0-36.0 g/dL 32.9  RDW Latest Ref Range: 11.5-15.5 % 13.7  Platelets Latest Ref Range: 150-400 K/uL 248   Treatments: IV hydration, antibiotics: Ancef, analgesia: Dilaudid, Tylenol and OxyIR, anticoagulation: LMW heparin, therapies: PT, OT and RN and surgery: As above   Discharge Exam:     Orthopaedic Trauma Service Progress Note  Subjective  Doing well No specific complaints Ready to go home  Review of Systems  Constitutional: Negative for fever and chills.  Respiratory: Negative for shortness of breath and wheezing.   Cardiovascular: Negative for chest pain and palpitations.  Gastrointestinal: Negative for nausea, vomiting and abdominal pain.  Neurological:  Negative for tingling, sensory change and headaches.     Objective   BP 151/99 mmHg  Pulse 73  Temp(Src) 98 F (36.7 C) (Oral)  Resp 17  Ht 6' 1.5" (1.867 m)  Wt 129.4 kg (285 lb 4.4 oz)  BMI 37.12 kg/m2  SpO2 94%  Intake/Output       01/22 0701 - 01/23 0700 01/23 0701 - 01/24 0700    P.O. 1230     I.V. (mL/kg) 600 (4.6)     IV Piggyback      Total Intake(mL/kg) 1830 (14.1)     Urine (mL/kg/hr) 650 (0.2)     Stool 0 (0)     Total Output 650      Net +1180            Urine Occurrence 2 x     Stool Occurrence 2 x       Labs  Results for TADEUSZ, STAHL (MRN [id number]) as of 06/27/2015 09:13   Ref. Range  06/27/2015 04:26   Sodium  Latest Ref Range: 135-145 mmol/L  135   Potassium  Latest Ref Range: 3.5-5.1 mmol/L  4.3   Chloride  Latest Ref Range: 101-111 mmol/L  102   CO2  Latest Ref Range: 22-32 mmol/L  23   BUN  Latest Ref Range: 6-20 mg/dL  9   Creatinine  Latest Ref Range: 0.61-1.24 mg/dL  0.77   Calcium  Latest Ref Range: 8.9-10.3 mg/dL  8.3 (L)   EGFR (Non-African Amer.)  Latest Ref Range: >60 mL/min  >60   EGFR (African American)  Latest Ref Range: >60 mL/min  >60   Glucose  Latest Ref Range: 65-99 mg/dL  110 (H)   Anion gap  Latest Ref Range: 5-15   10     Exam  Gen: Resting comfortably in bed, no acute distress Ext:        Left upper extremity             Splint is clean, dry, intact             Swelling stable             Extremity is warm             + Radial pulse and brisk capillary refill             Radial, ulnar, median nerve motor and sensory functions are intact including AIN and PIN motor functions   Assessment and Plan    POD/HD#: 58  55 year old male with left elbow dislocation and left radial head fracture  1. Left elbow dislocation and radial head fracture status post radial head arthroplasty and repair of lateral collateral ligament             Patient will remain splinted until follow-up             Sling on when mobilizing  otherwise it can be off             Continue with ice and elevation             Patient encouraged to move fingers to help with swelling   2. Pain management:  DC home with Percocet and breakthrough OxyIR  3. Hemodynamics             Stable  4. Medical issues               Mild hypertension                         Resume home meds  5. DVT/PE prophylaxis:             Patient does not require pharmacologic DVT prophylaxis  6. ID:               Perioperative antibiotics are complete   7. Activity:             As per #1  8. FEN/GI prophylaxis/Foley/Lines:             Diet as tolerated             DC IV   9. Dispo:             Discharge home today             Follow-up with orthopedics in 2 weeks    Disposition: 01-Home or Self Care      Discharge Instructions    Call MD / Call 911    Complete by:  As directed   If you experience chest pain or shortness of breath, CALL 911 and be transported to the hospital emergency room.  If you develope a fever above 101 F, pus (white drainage) or increased drainage or redness at the wound, or calf pain, call your surgeon's office.     Constipation Prevention    Complete by:  As directed   Drink plenty of fluids.  Prune juice may be helpful.  You may use a stool softener, such as Colace (over the counter) 100 mg twice a day.  Use MiraLax (over the counter) for constipation as needed.     Diet - low sodium heart healthy    Complete by:  As directed      Discharge instructions    Complete by:  As directed   Orthopaedic Trauma Service Discharge Instructions   General Discharge Instructions  WEIGHT BEARING STATUS: Nonweight bearing left arm  RANGE OF MOTION/ACTIVITY: Okay to move fingers and shoulder as tolerated. No elbow motion as you are in a splint. We will remove your splint at first follow-up. Keep splint clean, dry and intact.  Wound Care: Do not remove splint. No wound care at this time  PAIN MEDICATION USE  AND EXPECTATIONS  You have likely been given narcotic medications to help control your pain.  After a traumatic event that results in an fracture (broken bone) with or without surgery, it is ok to use narcotic pain medications to help control one's pain.  We understand that everyone responds to pain differently and each individual patient will be evaluated on a regular basis for the continued need for narcotic medications. Ideally, narcotic medication use should last no more than 6-8 weeks (coinciding with fracture healing).   As a patient it is your responsibility as well to monitor narcotic medication use and report the amount and frequency you use these medications when you come to your office visit.   We would also advise that if you are using narcotic medications, you should take a dose prior to therapy to maximize you participation.  IF YOU ARE ON NARCOTIC MEDICATIONS IT IS NOT PERMISSIBLE TO OPERATE A  MOTOR VEHICLE (MOTORCYCLE/CAR/TRUCK/MOPED) OR HEAVY MACHINERY DO NOT MIX NARCOTICS WITH OTHER CNS (CENTRAL NERVOUS SYSTEM) DEPRESSANTS SUCH AS ALCOHOL  Diet: as you were eating previously.  Can use over the counter stool softeners and bowel preparations, such as Miralax, to help with bowel movements.  Narcotics can be constipating.  Be sure to drink plenty of fluids    STOP SMOKING OR USING NICOTINE PRODUCTS!!!!  As discussed nicotine severely impairs your body's ability to heal surgical and traumatic wounds but also impairs bone healing.  Wounds and bone heal by forming microscopic blood vessels (angiogenesis) and nicotine is a vasoconstrictor (essentially, shrinks blood vessels).  Therefore, if vasoconstriction occurs to these microscopic blood vessels they essentially disappear and are unable to deliver necessary nutrients to the healing tissue.  This is one modifiable factor that you can do to dramatically increase your chances of healing your injury.    (This means no smoking, no nicotine gum,  patches, etc)  DO NOT USE NONSTEROIDAL ANTI-INFLAMMATORY DRUGS (NSAID'S)  Using products such as Advil (ibuprofen), Aleve (naproxen), Motrin (ibuprofen) for additional pain control during fracture healing can delay and/or prevent the healing response.  If you would like to take over the counter (OTC) medication, Tylenol (acetaminophen) is ok.  However, some narcotic medications that are given for pain control contain acetaminophen as well. Therefore, you should not exceed more than 4000 mg of tylenol in a day if you do not have liver disease.  Also note that there are may OTC medicines, such as cold medicines and allergy medicines that my contain tylenol as well.  If you have any questions about medications and/or interactions please ask your doctor/PA or your pharmacist.      ICE AND ELEVATE INJURED/OPERATIVE EXTREMITY  Using ice and elevating the injured extremity above your heart can help with swelling and pain control.  Icing in a pulsatile fashion, such as 20 minutes on and 20 minutes off, can be followed.    Do not place ice directly on skin. Make sure there is a barrier between to skin and the ice pack.    Using frozen items such as frozen peas works well as the conform nicely to the are that needs to be iced.  USE AN ACE WRAP OR TED HOSE FOR SWELLING CONTROL  In addition to icing and elevation, Ace wraps or TED hose are used to help limit and resolve swelling.  It is recommended to use Ace wraps or TED hose until you are informed to stop.    When using Ace Wraps start the wrapping distally (farthest away from the body) and wrap proximally (closer to the body)   Example: If you had surgery on your leg or thing and you do not have a splint on, start the ace wrap at the toes and work your way up to the thigh        If you had surgery on your upper extremity and do not have a splint on, start the ace wrap at your fingers and work your way up to the upper arm  IF YOU ARE IN A SPLINT OR CAST DO  NOT [place]   If your splint gets wet for any reason please contact the office immediately. You may shower in your splint or cast as [place] as you keep it dry.  This can be done by wrapping in a cast cover or garbage back (or similar)  Do Not stick any thing down your splint or cast such  as pencils, money, or hangers to try and scratch yourself with.  If you feel itchy take benadryl as prescribed on the bottle for itching  IF YOU ARE IN A CAM BOOT (BLACK BOOT)  You may remove boot periodically. Perform daily dressing changes as noted below.  Wash the liner of the boot regularly and wear a sock when wearing the boot. It is recommended that you sleep in the boot until told otherwise  CALL THE OFFICE WITH ANY QUESTIONS OR CONCERTS: [phone]     Driving restrictions    Complete by:  As directed   No driving     Increase activity slowly as tolerated    Complete by:  As directed      Lifting restrictions    Complete by:  As directed   No lifting     Non weight bearing    Complete by:  As directed   Laterality:  left  Extremity:  Upper            Medication List    TAKE these medications        amLODipine 5 MG tablet  Commonly known as:  NORVASC  Take 5 mg by mouth daily.     aspirin EC 325 MG tablet  Take 325 mg by mouth daily.     buPROPion 100 MG tablet  Commonly known as:  WELLBUTRIN  Take 1 tablet (100 mg total) by mouth 1 day or 1 dose.     carvedilol 6.25 MG tablet  Commonly known as:  COREG  Take 6.25 mg by mouth 2 (two) times daily with a meal.     cetirizine 10 MG tablet  Commonly known as:  ZYRTEC  Take 1 tablet (10 mg total) by mouth daily.     docusate sodium 100 MG capsule  Commonly known as:  COLACE  Take 1 capsule (100 mg total) by mouth 2 (two) times daily.     FLUoxetine 20 MG tablet  Commonly known as:  PROZAC  TAKE 1 TABLET BY MOUTH EVERY DAY     fluticasone 50 MCG/ACT nasal spray  Commonly known as:  FLONASE  Place 2 sprays  into the nose daily.     methocarbamol 500 MG tablet  Commonly known as:  ROBAXIN  Take 2 tablets (1,000 mg total) by mouth 4 (four) times daily.     oxyCODONE 5 MG immediate release tablet  Commonly known as:  ROXICODONE  Take 1-2 tablets (5-10 mg total) by mouth every 6 (six) hours as needed for breakthrough pain (Take between Percocet (oxycodone/acetaminophen) for breakthrough pain only).     oxyCODONE-acetaminophen 5-325 MG tablet  Commonly known as:  ROXICET  Take 1-2 tablets by mouth every 6 (six) hours as needed for severe pain.       Follow-up Information    Schedule an appointment as soon as possible for a visit with Rozanna Box, MD.   Specialty:  Orthopedic Surgery   Why:  For suture removal, For wound re-check   Contact information:   [address] [place]  [phone]       Discharge Instructions and Plan:  55 year old male with left elbow dislocation and left radial head fracture  1. Left elbow dislocation and radial head fracture status post radial head arthroplasty and repair of lateral collateral ligament             Patient will remain splinted until follow-up  Sling on when mobilizing otherwise it can be off             Continue with ice and elevation             Patient encouraged to move fingers to help with swelling   2. Pain management:             DC home with Percocet and breakthrough OxyIR  3. Hemodynamics             Stable  4. Medical issues               Mild hypertension                         Resume home meds  5. DVT/PE prophylaxis:             Patient does not require pharmacologic DVT prophylaxis  6. ID:               Perioperative antibiotics are complete   7. Activity:             As per #1  8. FEN/GI prophylaxis/Foley/Lines:             Diet as tolerated             DC IV   9. Dispo:             Discharge home today             Follow-up with orthopedics in 2  weeks   Signed:  Jari Pigg, PA-C [facility] [phone] (P) 06/27/2015, 9:27 AM

## 2015-06-27 NOTE — Progress Notes (Signed)
Physical Therapy Treatment Patient Details Name: Phillip Ortega MRN: [id number] DOB: [birth year] Today's Date: 06/27/2015    History of Present Illness [name] is a 55 yo male admitted 06/25/15 with Lt elbow fx/dislocation.  Now s/p repair elbow dislocation and radial head arthroplasty Lt.   PMH:  HTN, TIA    PT Comments    Patient progressing well towards PT goals. Improved ambulation distance and tolerated stair training with supervision for safety. Discussed positioning, elevation and exercises for LUE. Pt reports able to mobilize digits better today- some swelling noted. Pt plans to d/c home today with support from family. Will follow acutely if still in hospital.   Follow Up Recommendations  No PT follow up;Supervision/Assistance - 24 hour     Equipment Recommendations  None recommended by PT    Recommendations for Other Services       Precautions / Restrictions Precautions Precautions: Fall Precaution Comments: Per MD notes pt. to have L elbow immibilzed at 90 degrees for 2 weeks. Required Braces or Orthoses: Sling Restrictions Weight Bearing Restrictions: Yes LUE Weight Bearing: Non weight bearing    Mobility  Bed Mobility Overal bed mobility: Needs Assistance Bed Mobility: Rolling;Sidelying to Sit;Sit to Sidelying Rolling: Modified independent (Device/Increase time) Sidelying to sit: Modified independent (Device/Increase time)     Sit to sidelying: Modified independent (Device/Increase time) General bed mobility comments: HOB flat, no use of rails to simulate home.   Transfers Overall transfer level: Needs assistance Equipment used: None Transfers: Sit to/from Stand Sit to Stand: Supervision         General transfer comment: Supervision for safety. Pt supporting LUE with RUE.   Ambulation/Gait Ambulation/Gait assistance: Supervision Ambulation Distance (Feet): 500 Feet Assistive device: None Gait Pattern/deviations: Step-through pattern;Decreased stride  length;Drifts right/left Gait velocity: decreased Gait velocity interpretation: Below normal speed for age/gender General Gait Details: Pt with mostly steady gait with mild drifting noted but no overt LOB. Holds LUE with RUE but encouraged to allow arm swing on RUE.   Stairs Stairs: Yes Stairs assistance: Supervision Stair Management: Forwards;One rail Right Number of Stairs: 3 General stair comments: Limited by IV line. Safe stair negotiation with cues for safety. Pt has wall to hold onto on right side.   Wheelchair Mobility    Modified Rankin (Stroke Patients Only)       Balance Overall balance assessment: Needs assistance Sitting-balance support: Feet supported;No upper extremity supported Sitting balance-Leahy Scale: Good     Standing balance support: During functional activity Standing balance-Leahy Scale: Good                      Cognition Arousal/Alertness: Awake/alert Behavior During Therapy: WFL for tasks assessed/performed Overall Cognitive Status: Within Functional Limits for tasks assessed                      Exercises      General Comments General comments (skin integrity, edema, etc.): Reviewed positioning of LUE with pillows, elevation and AROM digits.      Pertinent Vitals/Pain Pain Assessment: Faces Faces Pain Scale: Hurts a little bit Pain Location: L elbow Pain Descriptors / Indicators: Sore Pain Intervention(s): Monitored during session;Repositioned    Home Living                      Prior Function            PT Goals (current goals can now be found in the care plan section) Progress  towards PT goals: Progressing toward goals    Frequency  Min 3X/week    PT Plan Current plan remains appropriate    Co-evaluation             End of Session Equipment Utilized During Treatment: Other (comment);Gait belt (sling) Activity Tolerance: Patient tolerated treatment well Patient left: in bed;with call  bell/phone within reach (sitting EOB.)     Time: 1610-9604 PT Time Calculation (min) (ACUTE ONLY): 18 min  Charges:  $Gait Training: 8-22 mins                    G Codes:      Tiny Chaudhary A Irma Delancey 06/27/2015, 10:25 AM Mylo Red, PT, DPT [phone]

## 2015-06-27 NOTE — Progress Notes (Signed)
Orthopaedic Trauma Service Progress Note  Subjective  Doing well No specific complaints Ready to go home  Review of Systems  Constitutional: Negative for fever and chills.  Respiratory: Negative for shortness of breath and wheezing.   Cardiovascular: Negative for chest pain and palpitations.  Gastrointestinal: Negative for nausea, vomiting and abdominal pain.  Neurological: Negative for tingling, sensory change and headaches.     Objective   BP 151/99 mmHg  Pulse 73  Temp(Src) 98 F (36.7 C) (Oral)  Resp 17  Ht 6' 1.5" (1.867 m)  Wt 129.4 kg (285 lb 4.4 oz)  BMI 37.12 kg/m2  SpO2 94%  Intake/Output      01/22 0701 - 01/23 0700 01/23 0701 - 01/24 0700   P.O. 1230    I.V. (mL/kg) 600 (4.6)    IV Piggyback     Total Intake(mL/kg) 1830 (14.1)    Urine (mL/kg/hr) 650 (0.2)    Stool 0 (0)    Total Output 650     Net +1180          Urine Occurrence 2 x    Stool Occurrence 2 x      Labs  Results for Phillip Ortega, Phillip Ortega (MRN [id number]) as of 06/27/2015 09:13  Ref. Range 06/27/2015 04:26  Sodium Latest Ref Range: 135-145 mmol/L 135  Potassium Latest Ref Range: 3.5-5.1 mmol/L 4.3  Chloride Latest Ref Range: 101-111 mmol/L 102  CO2 Latest Ref Range: 22-32 mmol/L 23  BUN Latest Ref Range: 6-20 mg/dL 9  Creatinine Latest Ref Range: 0.61-1.24 mg/dL 0.77  Calcium Latest Ref Range: 8.9-10.3 mg/dL 8.3 (L)  EGFR (Non-African Amer.) Latest Ref Range: >60 mL/min >60  EGFR (African American) Latest Ref Range: >60 mL/min >60  Glucose Latest Ref Range: 65-99 mg/dL 110 (H)  Anion gap Latest Ref Range: 5-15  10    Exam  Gen: Resting comfortably in bed, no acute distress Ext:       Left upper extremity  Splint is clean, dry, intact  Swelling stable  Extremity is warm  + Radial pulse and brisk capillary refill  Radial, ulnar, median nerve motor and sensory functions are intact including AIN and PIN motor functions   Assessment and Plan   POD/HD#: 14  55 year old male with  left elbow dislocation and left radial head fracture  1. Left elbow dislocation and radial head fracture status post radial head arthroplasty and repair of lateral collateral ligament  Patient will remain splinted until follow-up  Sling on when mobilizing otherwise it can be off  Continue with ice and elevation  Patient encouraged to move fingers to help with swelling   2. Pain management:  DC home with Percocet and breakthrough OxyIR  3. Hemodynamics  Stable  4. Medical issues   Mild hypertension   Resume home meds  5. DVT/PE prophylaxis:  Patient does not require pharmacologic DVT prophylaxis  6. ID:   Perioperative antibiotics are complete   7. Activity:  As per #1  8. FEN/GI prophylaxis/Foley/Lines:  Diet as tolerated  DC IV   9. Dispo:  Discharge home today  Follow-up with orthopedics in 2 weeks    Jari Pigg, PA-C [facility] [phone] ([phone]) 06/27/2015 9:12 AM

## 2015-06-27 NOTE — Progress Notes (Signed)
Occupational Therapy Treatment Patient Details Name: Phillip Ortega MRN: [id number] DOB: Oct 09, 1960 Today's Date: 06/27/2015    History of present illness [name] is a 55 yo male admitted 06/25/15 with Lt elbow fx/dislocation.  Now s/p repair elbow dislocation and radial head arthroplasty Lt.   PMH:  HTN, TIA   OT comments  Focus of this session was education and return demo of ROM for digits, and wrist for continued efforts with edema management of LUE.  Reviewed pillow placement and need for elevation and sling positioning.  Pt. Groggy and in/out of sleep but verbalized understanding with prompting to stay awake.  Eager for d/c home and plans for d/c home today if able.  Agree with initial rec. For HHOT to continue with therapy once home.    Follow Up Recommendations  Home health OT    Equipment Recommendations  None recommended by OT    Recommendations for Other Services      Precautions / Restrictions Precautions Precautions: Fall Precaution Comments: Per MD notes pt. to have L elbow immibilzed at 90 degrees for 2 weeks. Required Braces or Orthoses: Sling Restrictions Weight Bearing Restrictions: Yes LUE Weight Bearing: Non weight bearing       Mobility Bed Mobility Overal bed mobility: Needs Assistance Bed Mobility: Rolling;Sidelying to Sit;Sit to Sidelying Rolling: Modified independent (Device/Increase time) Sidelying to sit: Modified independent (Device/Increase time)     Sit to sidelying: Modified independent (Device/Increase time) General bed mobility comments: HOB flat, no use of rails to simulate home.   Transfers Overall transfer level: Needs assistance Equipment used: None Transfers: Sit to/from Stand Sit to Stand: Supervision         General transfer comment: Supervision for safety. Pt supporting LUE with RUE.     Balance Overall balance assessment: Needs assistance Sitting-balance support: Feet supported;No upper extremity supported Sitting  balance-Leahy Scale: Good     Standing balance support: During functional activity Standing balance-Leahy Scale: Good                     ADL Overall ADL's : Needs assistance/impaired                   Upper Body Dressing Details (indicate cue type and reason): reviewed donning sling and sling management       Toilet Transfer Details (indicate cue type and reason): pt. reports easier to amb. to b.room to urinate vs use of urinal, declined need for review                  Vision                     Perception     Praxis      Cognition   Behavior During Therapy: Flat affect Overall Cognitive Status: Difficult to assess                       Extremity/Trunk Assessment               Exercises Hand Exercises Wrist Flexion: AROM;Left;Supine;5 reps Wrist Extension: AROM;Left;5 reps;Supine Digit Composite Flexion: AROM;Left;5 reps;Supine Composite Extension: AROM;Left;5 reps;Supine Other Exercises Other Exercises: reviewed benefits of completing rom for edema management    Shoulder Instructions       General Comments      Pertinent Vitals/ Pain       Pain Assessment: No/denies pain Faces Pain Scale: Hurts a little bit Pain Location: L elbow Pain  Descriptors / Indicators: Sore Pain Intervention(s): Monitored during session;Repositioned  Home Living                                          Prior Functioning/Environment              Frequency Min 3X/week     Progress Toward Goals  OT Goals(current goals can now be found in the care plan section)  Progress towards OT goals: Progressing toward goals     Plan Discharge plan remains appropriate    Co-evaluation                 End of Session     Activity Tolerance Patient limited by lethargy   Patient Left     Nurse Communication          Time: 5621-3086 OT Time Calculation (min): 9 min  Charges: OT General Charges $OT  Visit: 1 Procedure OT Treatments $Therapeutic Exercise: 8-22 mins  Robet Leu, COTA/L 06/27/2015, 11:03 AM

## 2015-06-27 NOTE — Discharge Instructions (Signed)
Orthopaedic Trauma Service Discharge Instructions   General Discharge Instructions  WEIGHT BEARING STATUS: Nonweight bearing left arm  RANGE OF MOTION/ACTIVITY: Okay to move fingers and shoulder as tolerated. No elbow motion as you are in a splint. We will remove your splint at first follow-up. Keep splint clean, dry and intact.  Wound Care: Do not remove splint. No wound care at this time  PAIN MEDICATION USE AND EXPECTATIONS  You have likely been given narcotic medications to help control your pain.  After a traumatic event that results in an fracture (broken bone) with or without surgery, it is ok to use narcotic pain medications to help control one's pain.  We understand that everyone responds to pain differently and each individual patient will be evaluated on a regular basis for the continued need for narcotic medications. Ideally, narcotic medication use should last no more than 6-8 weeks (coinciding with fracture healing).   As a patient it is your responsibility as well to monitor narcotic medication use and report the amount and frequency you use these medications when you come to your office visit.   We would also advise that if you are using narcotic medications, you should take a dose prior to therapy to maximize you participation.  IF YOU ARE ON NARCOTIC MEDICATIONS IT IS NOT PERMISSIBLE TO OPERATE A MOTOR VEHICLE (MOTORCYCLE/CAR/TRUCK/MOPED) OR HEAVY MACHINERY DO NOT MIX NARCOTICS WITH OTHER CNS (CENTRAL NERVOUS SYSTEM) DEPRESSANTS SUCH AS ALCOHOL  Diet: as you were eating previously.  Can use over the counter stool softeners and bowel preparations, such as Miralax, to help with bowel movements.  Narcotics can be constipating.  Be sure to drink plenty of fluids    STOP SMOKING OR USING NICOTINE PRODUCTS!!!!  As discussed nicotine severely impairs your body's ability to heal surgical and traumatic wounds but also impairs bone healing.  Wounds and bone heal by forming microscopic  blood vessels (angiogenesis) and nicotine is a vasoconstrictor (essentially, shrinks blood vessels).  Therefore, if vasoconstriction occurs to these microscopic blood vessels they essentially disappear and are unable to deliver necessary nutrients to the healing tissue.  This is one modifiable factor that you can do to dramatically increase your chances of healing your injury.    (This means no smoking, no nicotine gum, patches, etc)  DO NOT USE NONSTEROIDAL ANTI-INFLAMMATORY DRUGS (NSAID'S)  Using products such as Advil (ibuprofen), Aleve (naproxen), Motrin (ibuprofen) for additional pain control during fracture healing can delay and/or prevent the healing response.  If you would like to take over the counter (OTC) medication, Tylenol (acetaminophen) is ok.  However, some narcotic medications that are given for pain control contain acetaminophen as well. Therefore, you should not exceed more than 4000 mg of tylenol in a day if you do not have liver disease.  Also note that there are may OTC medicines, such as cold medicines and allergy medicines that my contain tylenol as well.  If you have any questions about medications and/or interactions please ask your doctor/PA or your pharmacist.      ICE AND ELEVATE INJURED/OPERATIVE EXTREMITY  Using ice and elevating the injured extremity above your heart can help with swelling and pain control.  Icing in a pulsatile fashion, such as 20 minutes on and 20 minutes off, can be followed.    Do not place ice directly on skin. Make sure there is a barrier between to skin and the ice pack.    Using frozen items such as frozen peas works well as the conform  nicely to the are that needs to be iced.  USE AN ACE WRAP OR TED HOSE FOR SWELLING CONTROL  In addition to icing and elevation, Ace wraps or TED hose are used to help limit and resolve swelling.  It is recommended to use Ace wraps or TED hose until you are informed to stop.    When using Ace Wraps start the  wrapping distally (farthest away from the body) and wrap proximally (closer to the body)   Example: If you had surgery on your leg or thing and you do not have a splint on, start the ace wrap at the toes and work your way up to the thigh        If you had surgery on your upper extremity and do not have a splint on, start the ace wrap at your fingers and work your way up to the upper arm  IF YOU ARE IN A SPLINT OR CAST DO NOT REMOVE IT FOR ANY REASON   If your splint gets wet for any reason please contact the office immediately. You may shower in your splint or cast as [place] as you keep it dry.  This can be done by wrapping in a cast cover or garbage back (or similar)  Do Not stick any thing down your splint or cast such as pencils, money, or hangers to try and scratch yourself with.  If you feel itchy take benadryl as prescribed on the bottle for itching  IF YOU ARE IN A CAM BOOT (BLACK BOOT)  You may remove boot periodically. Perform daily dressing changes as noted below.  Wash the liner of the boot regularly and wear a sock when wearing the boot. It is recommended that you sleep in the boot until told otherwise  CALL THE OFFICE WITH ANY QUESTIONS OR CONCERTS: [phone]

## 2015-07-04 ENCOUNTER — Encounter (HOSPITAL_COMMUNITY): Payer: Self-pay | Admitting: Orthopedic Surgery

## 2015-07-26 ENCOUNTER — Telehealth: Payer: Self-pay

## 2015-07-26 ENCOUNTER — Other Ambulatory Visit (INDEPENDENT_AMBULATORY_CARE_PROVIDER_SITE_OTHER): Payer: BLUE CROSS/BLUE SHIELD

## 2015-07-26 ENCOUNTER — Encounter: Payer: Self-pay | Admitting: Neurology

## 2015-07-26 ENCOUNTER — Ambulatory Visit (INDEPENDENT_AMBULATORY_CARE_PROVIDER_SITE_OTHER): Payer: BLUE CROSS/BLUE SHIELD | Admitting: Neurology

## 2015-07-26 VITALS — BP 138/90 | HR 80 | Ht 73.0 in | Wt 265.0 lb

## 2015-07-26 DIAGNOSIS — R739 Hyperglycemia, unspecified: Secondary | ICD-10-CM

## 2015-07-26 DIAGNOSIS — G459 Transient cerebral ischemic attack, unspecified: Secondary | ICD-10-CM

## 2015-07-26 DIAGNOSIS — I10 Essential (primary) hypertension: Secondary | ICD-10-CM | POA: Diagnosis not present

## 2015-07-26 LAB — LIPID PANEL
CHOL/HDL RATIO: 5
Cholesterol: 165 mg/dL (ref 0–200)
HDL: 33.9 mg/dL — AB (ref >39.00)
LDL Cholesterol: 105 mg/dL — ABNORMAL HIGH (ref 0–99)
NONHDL: 131.15
Triglycerides: 129 mg/dL (ref 0.0–149.0)
VLDL: 25.8 mg/dL (ref 0.0–40.0)

## 2015-07-26 LAB — HEMOGLOBIN A1C: HEMOGLOBIN A1C: 6.1 % (ref 4.6–6.5)

## 2015-07-26 MED ORDER — SIMVASTATIN 10 MG PO TABS: 10.0000 mg | ORAL_TABLET | Freq: Every day | ORAL | Status: DC

## 2015-07-26 NOTE — Progress Notes (Signed)
NEUROLOGY CONSULTATION NOTE  Phillip Ortega MRN: [id number] DOB: 26-Apr-1961  Referring provider: Dr. Susann Givens Primary care provider: Dr. Susann Givens  Reason for consult:  TIA  HISTORY OF PRESENT ILLNESS: Phillip Ortega is a 55 year old right-handed male with hypertension who presents for TIA.  History obtained by patient, his wife, PCP note and hospital discharge summary.  Reports of CT, echo and carotid doppler, as well as labs reviewed.  He was admitted to [facility] on 06/18/15 when he tripped and fell, fracturing and dislocating his left elbow.  He is not sure what happened but suddenly lost control of his body.  He did not hit his head or lose consciousness.  In the ED, he was noted to have left sided facial, arm and leg weakness with slurred speech.  He did not have headache.  Symptoms lasted approximately 10 minutes.  Blood pressure was around 180s/88.  CT of head showed no acute infarct.  Telemetry revealed no A fib.  2D echo showed EF 55-60% with no cardiac source of emboli.  Carotid doppler showed no hemodynamically significant stenosis.  Serum glucose was 156.  LDL was 108.  He was discharged on ASA  daily and started on antihypertensive medication.  He reports having a brief sensation of "loss of control" of his body about a year ago, in which he fell.  He denied any weakness at that time.  He denies history of head trauma or seizures.  He had migraines as a child.  PAST MEDICAL HISTORY: Past Medical History  Diagnosis Date  . Hypertension   . Allergy     rhinitis  . TIA (transient ischemic attack)   . Dislocation of left elbow 06/27/2015    PAST SURGICAL HISTORY: Past Surgical History  Procedure Laterality Date  . Laparoscopic cholecystectomy  07    T[place]  . Orif elbow fracture Left 06/25/2015    Procedure: OPEN REDUCTION INTERNAL FIXATION (ORIF) ELBOW/OLECRANON FRACTURE RADIO HEAD ARTHROPLASTY;  Surgeon: Myrene Galas, MD;  Location: [facility] OR;  Service:  Orthopedics;  Laterality: Left;    MEDICATIONS: Current Outpatient Prescriptions on File Prior to Visit  Medication Sig Dispense Refill  . amLODipine (NORVASC) 5 MG tablet Take 5 mg by mouth daily.    Marland Kitchen aspirin EC 325 MG tablet Take 325 mg by mouth daily.    . carvedilol (COREG) 6.25 MG tablet Take 6.25 mg by mouth 2 (two) times daily with a meal.    . cetirizine (ZYRTEC) 10 MG tablet Take 1 tablet (10 mg total) by mouth daily. (Patient taking differently: Take 10 mg by mouth daily as needed for allergies. ) 30 tablet 2  . docusate sodium (COLACE) 100 MG capsule Take 1 capsule (100 mg total) by mouth 2 (two) times daily. 30 capsule 0  . FLUoxetine (PROZAC) 20 MG tablet TAKE 1 TABLET BY MOUTH EVERY DAY 30 tablet 0  . methocarbamol (ROBAXIN) 500 MG tablet Take 2 tablets (1,000 mg total) by mouth 4 (four) times daily. 90 tablet 0  . oxyCODONE (ROXICODONE) 5 MG immediate release tablet Take 1-2 tablets (5-10 mg total) by mouth every 6 (six) hours as needed for breakthrough pain (Take between Percocet (oxycodone/acetaminophen) for breakthrough pain only). 60 tablet 0  . oxyCODONE-acetaminophen (ROXICET) 5-325 MG tablet Take 1-2 tablets by mouth every 6 (six) hours as needed for severe pain. 90 tablet 0  . buPROPion (WELLBUTRIN) 100 MG tablet Take 1 tablet (100 mg total) by mouth 1 day or 1 dose. 30 tablet  1  . fluticasone (FLONASE) 50 MCG/ACT nasal spray Place 2 sprays into the nose daily. (Patient taking differently: Place 2 sprays into the nose daily as needed for allergies. ) 16 g 5   Current Facility-Administered Medications on File Prior to Visit  Medication Dose Route Frequency Provider Last Rate Last Dose  . ipratropium-albuterol (DUONEB) 0.5-2.5 (3) MG/3ML nebulizer solution 3 mL  3 mL Nebulization Once Jac Canavan, PA-C        ALLERGIES: No Known Allergies  FAMILY HISTORY: Family History  Problem Relation Age of Onset  . Anemia Sister   . Migraines Mother   . Dementia Mother       SOCIAL HISTORY: Social History   Social History  . Marital Status: Married    Spouse Name: N/A  . Number of Children: N/A  . Years of Education: N/A   Occupational History  . Not on file.   Social History Main Topics  . Smoking status: Never Smoker   . Smokeless tobacco: Never Used  . Alcohol Use: 0.0 oz/week    0 Standard drinks or equivalent per week     Comment: social  . Drug Use: No  . Sexual Activity:    Partners: Female   Other Topics Concern  . Not on file   Social History Narrative    REVIEW OF SYSTEMS: Constitutional: No fevers, chills, or sweats, no generalized fatigue, change in appetite Eyes: No visual changes, double vision, eye pain Ear, nose and throat: No hearing loss, ear pain, nasal congestion, sore throat Cardiovascular: No chest pain, palpitations Respiratory:  No shortness of breath at rest or with exertion, wheezes GastrointestinaI: No nausea, vomiting, diarrhea, abdominal pain, fecal incontinence Genitourinary:  No dysuria, urinary retention or frequency Musculoskeletal:  No neck pain, back pain Integumentary: No rash, pruritus, skin lesions Neurological: as above Psychiatric: No depression, insomnia, anxiety Endocrine: No palpitations, fatigue, diaphoresis, mood swings, change in appetite, change in weight, increased thirst Hematologic/Lymphatic:  No anemia, purpura, petechiae. Allergic/Immunologic: no itchy/runny eyes, nasal congestion, recent allergic reactions, rashes  PHYSICAL EXAM: Filed Vitals:   07/26/15 0849  BP: 138/90  Pulse: 80   General: No acute distress.  Patient appears well-groomed.  Head:  Normocephalic/atraumatic Eyes:  fundi unremarkable, without vessel changes, exudates, hemorrhages or papilledema. Neck: supple, no paraspinal tenderness, full range of motion Back: No paraspinal tenderness Heart: regular rate and rhythm Lungs: Clear to auscultation bilaterally. Vascular: No carotid bruits. Neurological  Exam: Mental status: alert and oriented to person, place, and time, recent and remote memory intact, fund of knowledge intact, attention and concentration intact, speech fluent and not dysarthric, language intact. Cranial nerves: CN I: not tested CN II: pupils equal, round and reactive to light, visual fields intact, fundi unremarkable, without vessel changes, exudates, hemorrhages or papilledema. CN III, IV, VI:  full range of motion, no nystagmus, no ptosis CN V: facial sensation intact CN VII: upper and lower face symmetric CN VIII: hearing intact CN IX, X: gag intact, uvula midline CN XI: sternocleidomastoid and trapezius muscles intact CN XII: tongue midline Bulk & Tone: normal, no fasciculations. Motor:  5/5 throughout.  Unable to assess left upper extremity due to elbow sling but grip normal. Sensation:  Pinprick and vibration sensation intact. Deep Tendon Reflexes:  2+ throughout, toes downgoing.  Finger to nose testing:  Without dysmetria.  Heel to shin:  Without dysmetria.  Gait:  Normal station and stride.  Able to turn and tandem walk. Romberg negative.  IMPRESSION: Probable TIA Hypertension  PLAN: 1.  Change ASA  to  daily 2.  Will start simvastatin  daily.  LDL goal should be less than 100.  Recheck fasting lipid panel prior to follow up 3.  Check MRI of brain 4.  Check Hgb A1c 5.  Blood pressure control 6.  Follow up around 3 months   Thank you for allowing me to take part in the care of this patient.  [name], DO  CC:  Sharlot Gowda, MD

## 2015-07-26 NOTE — Telephone Encounter (Signed)
-----   Message from Drema Dallas, DO sent at 07/26/2015  3:30 PM EST ----- He does not appear to have diabetes.  He should start simvastatin and we will repeat fasting lipid panel prior to follow up

## 2015-07-26 NOTE — Telephone Encounter (Signed)
Left message on machine for pt to return call to the office.  

## 2015-07-26 NOTE — Patient Instructions (Signed)
1.  Instead of aspirin 325mg , take enteric-coated aspirin  daily 2.  Start simvastatin  daily (prescription sent) to [place] 5.  Check MRI of brain without contrast 6.  Follow up

## 2015-08-01 ENCOUNTER — Emergency Department (HOSPITAL_COMMUNITY): Payer: BLUE CROSS/BLUE SHIELD

## 2015-08-01 ENCOUNTER — Emergency Department (HOSPITAL_COMMUNITY)
Admission: EM | Admit: 2015-08-01 | Discharge: 2015-08-01 | Disposition: A | Discharge status: Home / Self Care | Payer: BLUE CROSS/BLUE SHIELD | Attending: Emergency Medicine | Admitting: Emergency Medicine

## 2015-08-01 ENCOUNTER — Encounter (HOSPITAL_COMMUNITY): Payer: Self-pay | Admitting: *Deleted

## 2015-08-01 DIAGNOSIS — Z9049 Acquired absence of other specified parts of digestive tract: Secondary | ICD-10-CM | POA: Diagnosis not present

## 2015-08-01 DIAGNOSIS — I10 Essential (primary) hypertension: Secondary | ICD-10-CM | POA: Insufficient documentation

## 2015-08-01 DIAGNOSIS — Z79899 Other long term (current) drug therapy: Secondary | ICD-10-CM | POA: Insufficient documentation

## 2015-08-01 DIAGNOSIS — Z87828 Personal history of other (healed) physical injury and trauma: Secondary | ICD-10-CM | POA: Diagnosis not present

## 2015-08-01 DIAGNOSIS — Z8673 Personal history of transient ischemic attack (TIA), and cerebral infarction without residual deficits: Secondary | ICD-10-CM | POA: Diagnosis not present

## 2015-08-01 DIAGNOSIS — N23 Unspecified renal colic: Secondary | ICD-10-CM | POA: Insufficient documentation

## 2015-08-01 DIAGNOSIS — R109 Unspecified abdominal pain: Secondary | ICD-10-CM | POA: Diagnosis present

## 2015-08-01 LAB — BASIC METABOLIC PANEL
Anion gap: 11 (ref 5–15)
BUN: 20 mg/dL (ref 6–20)
CALCIUM: 9.3 mg/dL (ref 8.9–10.3)
CO2: 23 mmol/L (ref 22–32)
CREATININE: 1 mg/dL (ref 0.61–1.24)
Chloride: 107 mmol/L (ref 101–111)
GFR calc Af Amer: >60 mL/min (ref >60)
GLUCOSE: 123 mg/dL — AB (ref 65–99)
Potassium: 3.6 mmol/L (ref 3.5–5.1)
SODIUM: 141 mmol/L (ref 135–145)

## 2015-08-01 LAB — CBC WITH DIFFERENTIAL/PLATELET
Basophils Absolute: 0 10*3/uL (ref 0.0–0.1)
Basophils Relative: 0 %
EOS ABS: 0.5 10*3/uL (ref 0.0–0.7)
Eosinophils Relative: 5 %
HCT: 42.7 % (ref 39.0–52.0)
Hemoglobin: 14 g/dL (ref 13.0–17.0)
LYMPHS ABS: 2.6 10*3/uL (ref 0.7–4.0)
LYMPHS PCT: 27 %
MCH: 28.3 pg (ref 26.0–34.0)
MCHC: 32.8 g/dL (ref 30.0–36.0)
MCV: 86.4 fL (ref 78.0–100.0)
Monocytes Absolute: 0.9 10*3/uL (ref 0.1–1.0)
Monocytes Relative: 9 %
NEUTROS ABS: 5.6 10*3/uL (ref 1.7–7.7)
NEUTROS PCT: 59 %
PLATELETS: 242 10*3/uL (ref 150–400)
RBC: 4.94 MIL/uL (ref 4.22–5.81)
RDW: 13.8 % (ref 11.5–15.5)
WBC: 9.6 10*3/uL (ref 4.0–10.5)

## 2015-08-01 LAB — URINALYSIS, ROUTINE W REFLEX MICROSCOPIC
BILIRUBIN URINE: NEGATIVE
Glucose, UA: NEGATIVE mg/dL
KETONES UR: NEGATIVE mg/dL
LEUKOCYTES UA: NEGATIVE
NITRITE: NEGATIVE
PROTEIN: NEGATIVE mg/dL
Specific Gravity, Urine: 1.023 (ref 1.005–1.030)
pH: 6 (ref 5.0–8.0)

## 2015-08-01 LAB — URINE MICROSCOPIC-ADD ON

## 2015-08-01 MED ORDER — HYDROMORPHONE HCL 1 MG/ML IJ SOLN
1.0000 mg | Freq: Once | INTRAMUSCULAR | Status: AC
Start: 2015-08-01 — End: 2015-08-01
  Administered 2015-08-01: 1 mg via INTRAVENOUS
  Filled 2015-08-01: qty 1

## 2015-08-01 MED ORDER — DOCUSATE SODIUM 100 MG PO CAPS: 100.0000 mg | ORAL_CAPSULE | Freq: Two times a day (BID) | ORAL | Status: DC

## 2015-08-01 MED ORDER — OXYCODONE-ACETAMINOPHEN 5-325 MG PO TABS: 2.0000 | ORAL_TABLET | ORAL | Status: DC | PRN

## 2015-08-01 MED ORDER — ONDANSETRON HCL 4 MG PO TABS: 4.0000 mg | ORAL_TABLET | Freq: Four times a day (QID) | ORAL | Status: DC

## 2015-08-01 MED ORDER — FENTANYL CITRATE (PF) 100 MCG/2ML IJ SOLN
50.0000 ug | INTRAMUSCULAR | Status: DC | PRN
Start: 2015-08-01 — End: 2015-08-01
  Administered 2015-08-01: 50 ug via INTRAVENOUS
  Filled 2015-08-01: qty 2

## 2015-08-01 MED ORDER — KETOROLAC TROMETHAMINE 15 MG/ML IJ SOLN
15.0000 mg | Freq: Once | INTRAMUSCULAR | Status: AC
  Administered 2015-08-01: 15 mg via INTRAVENOUS
  Filled 2015-08-01: qty 1

## 2015-08-01 MED ORDER — KETOROLAC TROMETHAMINE 30 MG/ML IJ SOLN
15.0000 mg | Freq: Once | INTRAMUSCULAR | Status: AC
  Administered 2015-08-01: 15 mg via INTRAVENOUS
  Filled 2015-08-01: qty 1

## 2015-08-01 MED ORDER — OXYCODONE-ACETAMINOPHEN 5-325 MG PO TABS
2.0000 | ORAL_TABLET | Freq: Once | ORAL | Status: AC
  Administered 2015-08-01: 2 via ORAL
  Filled 2015-08-01: qty 2

## 2015-08-01 MED ORDER — FENTANYL CITRATE (PF) 100 MCG/2ML IJ SOLN
50.0000 ug | Freq: Once | INTRAMUSCULAR | Status: AC
  Administered 2015-08-01: 50 ug via INTRAVENOUS
  Filled 2015-08-01: qty 2

## 2015-08-01 MED ORDER — TAMSULOSIN HCL 0.4 MG PO CAPS: 0.4000 mg | ORAL_CAPSULE | Freq: Every day | ORAL | Status: DC

## 2015-08-01 MED ORDER — ONDANSETRON HCL 4 MG/2ML IJ SOLN
4.0000 mg | Freq: Once | INTRAMUSCULAR | Status: AC
  Administered 2015-08-01: 4 mg via INTRAVENOUS
  Filled 2015-08-01: qty 2

## 2015-08-01 MED ORDER — IBUPROFEN 800 MG PO TABS: 800.0000 mg | ORAL_TABLET | Freq: Three times a day (TID) | ORAL | Status: DC

## 2015-08-01 NOTE — ED Notes (Signed)
Per pt report: Pt woke up around 03:15 to acute onset right flank pain that radiates to the front.  Pt denies N/V.  Pt denies hx of kidney stones. Pt had left arm surgery about a month ago.

## 2015-08-01 NOTE — ED Provider Notes (Signed)
Pt D/W Dr. Rhunette Croft.  Pt seen.  C/o continued intermittent flank and abd pain. Ct with 2mm UVJ stone.  Remedicated.  Will d/C with f./u at [facility].  Rolland Porter, MD 08/01/15 1022

## 2015-08-01 NOTE — Discharge Instructions (Signed)
Kidney Stones °Kidney stones (urolithiasis) are deposits that form inside your kidneys. The intense pain is caused by the stone moving through the urinary tract. When the stone moves, the ureter goes into spasm around the stone. The stone is usually passed in the urine.  °CAUSES  °· A disorder that makes certain neck glands produce too much parathyroid hormone (primary hyperparathyroidism). °· A buildup of uric acid crystals, similar to gout in your joints. °· Narrowing (stricture) of the ureter. °· A kidney obstruction present at birth (congenital obstruction). °· Previous surgery on the kidney or ureters. °· Numerous kidney infections. °SYMPTOMS  °· Feeling sick to your stomach (nauseous). °· Throwing up (vomiting). °· Blood in the urine (hematuria). °· Pain that usually spreads (radiates) to the groin. °· Frequency or urgency of urination. °DIAGNOSIS  °· Taking a history and physical exam. °· Blood or urine tests. °· CT scan. °· Occasionally, an examination of the inside of the urinary bladder (cystoscopy) is performed. °TREATMENT  °· Observation. °· Increasing your fluid intake. °· Extracorporeal shock wave lithotripsy--This is a noninvasive procedure that uses shock waves to break up kidney stones. °· Surgery may be needed if you have severe pain or persistent obstruction. There are various surgical procedures. Most of the procedures are performed with the use of small instruments. Only small incisions are needed to accommodate these instruments, so recovery time is minimized. °The size, location, and chemical composition are all important variables that will determine the proper choice of action for you. Talk to your health care provider to better understand your situation so that you will minimize the risk of injury to yourself and your kidney.  °HOME CARE INSTRUCTIONS  °· Drink enough water and fluids to keep your urine clear or pale yellow. This will help you to pass the stone or stone fragments. °· Strain  all urine through the provided strainer. Keep all particulate matter and stones for your health care provider to see. The stone causing the pain may be as small as a grain of salt. It is very important to use the strainer each and every time you pass your urine. The collection of your stone will allow your health care provider to analyze it and verify that a stone has actually passed. The stone analysis will often identify what you can do to reduce the incidence of recurrences. °· Only take over-the-counter or prescription medicines for pain, discomfort, or fever as directed by your health care provider. °· Keep all follow-up visits as told by your health care provider. This is important. °· Get follow-up X-rays if required. The absence of pain does not always mean that the stone has passed. It may have only stopped moving. If the urine remains completely obstructed, it can cause loss of kidney function or even complete destruction of the kidney. It is your responsibility to make sure X-rays and follow-ups are completed. Ultrasounds of the kidney can show blockages and the status of the kidney. Ultrasounds are not associated with any radiation and can be performed easily in a matter of minutes. °· Make changes to your daily diet as told by your health care provider. You may be told to: °¨ Limit the amount of salt that you eat. °¨ Eat 5 or more servings of fruits and vegetables each day. °¨ Limit the amount of meat, poultry, fish, and eggs that you eat. °· Collect a 24-hour urine sample as told by your health care provider. You may need to collect another urine sample every 6-12   months. °SEEK MEDICAL CARE IF: °· You experience pain that is progressive and unresponsive to any pain medicine you have been prescribed. °SEEK IMMEDIATE MEDICAL CARE IF:  °· Pain cannot be controlled with the prescribed medicine. °· You have a fever or shaking chills. °· The severity or intensity of pain increases over 18 hours and is not  relieved by pain medicine. °· You develop a new onset of abdominal pain. °· You feel faint or pass out. °· You are unable to urinate. °  °This information is not intended to replace advice given to you by your health care provider. Make sure you discuss any questions you have with your health care provider. °  °Document Released: 05/21/2005 Document Revised: 02/09/2015 Document Reviewed: 10/22/2012 °[employer]vier Interactive Patient Education ©2016 [employer] ° °

## 2015-08-01 NOTE — ED Provider Notes (Signed)
CSN: [id number]     Arrival date & time 08/01/15  0359 History   First MD Initiated Contact with Patient 08/01/15 [phone]     Chief Complaint  Patient presents with  . Flank Pain     (Consider location/radiation/quality/duration/timing/severity/associated sxs/prior Treatment) HPI Comments: Pt comes in with cc of flank pain. Pt had sudden onset severe R sided back pain that woke him up from sleep. He is s/p cholecystectomy. Hx of HTN, TIA. He has associated nausea, emesis. Pt has no hx of renal stones. Denies any uti like sx.   ROS 10 Systems reviewed and are negative for acute change except as noted in the HPI.     Patient is a 55 y.o. male presenting with flank pain. The history is provided by the patient.  Flank Pain    Past Medical History  Diagnosis Date  . Hypertension   . Allergy     rhinitis  . TIA (transient ischemic attack)   . Dislocation of left elbow 06/27/2015   Past Surgical History  Procedure Laterality Date  . Laparoscopic cholecystectomy  07    [place]  . Orif elbow fracture Left 06/25/2015    Procedure: OPEN REDUCTION INTERNAL FIXATION (ORIF) ELBOW/OLECRANON FRACTURE RADIO HEAD ARTHROPLASTY;  Surgeon: Myrene Galas, MD;  Location: [facility] OR;  Service: Orthopedics;  Laterality: Left;   Family History  Problem Relation Age of Onset  . Anemia Sister   . Migraines Mother   . Dementia Mother    Social History  Substance Use Topics  . Smoking status: Never Smoker   . Smokeless tobacco: Never Used  . Alcohol Use: 0.0 oz/week    0 Standard drinks or equivalent per week     Comment: social    Review of Systems  Genitourinary: Positive for flank pain.      Allergies  Review of patient's allergies indicates no known allergies.  Home Medications   Prior to Admission medications   Medication Sig Start Date End Date Taking? Authorizing Provider  carvedilol (COREG) 6.25 MG tablet Take 6.25 mg by mouth 2 (two) times daily with a meal.   Yes Historical  Provider, MD  cetirizine (ZYRTEC) 10 MG tablet Take 1 tablet (10 mg total) by mouth daily. Patient taking differently: Take 10 mg by mouth daily as needed for allergies.  10/13/12  Yes Kermit Balo Tysinger, PA-C  FLUoxetine (PROZAC) 20 MG capsule Take 20 mg by mouth daily. 05/18/15  Yes Historical Provider, MD  oxyCODONE-acetaminophen (ROXICET) 5-325 MG tablet Take 1-2 tablets by mouth every 6 (six) hours as needed for severe pain. 06/27/15  Yes Montez Morita, PA-C  simvastatin (ZOCOR) 10 MG tablet Take 1 tablet (10 mg total) by mouth daily. 07/26/15  Yes Adam Mliss Fritz, DO  buPROPion (WELLBUTRIN) 100 MG tablet Take 1 tablet (100 mg total) by mouth 1 day or 1 dose. Patient not taking: Reported on 08/01/2015 05/19/11 06/25/15  Rona Ravens Mashburn, PA-C  docusate sodium (COLACE) 100 MG capsule Take 1 capsule (100 mg total) by mouth 2 (two) times daily. Patient not taking: Reported on 08/01/2015 06/27/15   Montez Morita, PA-C  FLUoxetine (PROZAC) 20 MG tablet TAKE 1 TABLET BY MOUTH EVERY DAY Patient not taking: Reported on 08/01/2015 09/01/11   Cleotis Nipper, MD  methocarbamol (ROBAXIN) 500 MG tablet Take 2 tablets (1,000 mg total) by mouth 4 (four) times daily. Patient not taking: Reported on 08/01/2015 06/27/15   Montez Morita, PA-C  oxyCODONE (ROXICODONE) 5 MG immediate release tablet Take 1-2 tablets (  5-10 mg total) by mouth every 6 (six) hours as needed for breakthrough pain (Take between Percocet (oxycodone/acetaminophen) for breakthrough pain only). Patient not taking: Reported on 08/01/2015 06/27/15   Montez Morita, PA-C   BP 149/99 mmHg  Pulse 77  Temp(Src) 98.6 F (37 C) (Oral)  Resp 18  SpO2 98% Physical Exam  Constitutional: He is oriented to person, place, and time. He appears well-developed.  HENT:  Head: Normocephalic and atraumatic.  Eyes: Conjunctivae and EOM are normal. Pupils are equal, round, and reactive to light.  Neck: Normal range of motion. Neck supple.  Cardiovascular: Normal rate, regular rhythm  and normal heart sounds.   Pulmonary/Chest: Effort normal and breath sounds normal. No respiratory distress. He has no wheezes.  Abdominal: Soft. Bowel sounds are normal. He exhibits no distension. There is tenderness. There is no rebound and no guarding.  Neurological: He is alert and oriented to person, place, and time.  Skin: Skin is warm.    ED Course  Procedures (including critical care time) Labs Review Labs Reviewed  BASIC METABOLIC PANEL - Abnormal; Notable for the following:    Glucose, Bld 123 (*)    All other components within normal limits  URINALYSIS, ROUTINE W REFLEX MICROSCOPIC (NOT AT [facility]) - Abnormal; Notable for the following:    Hgb urine dipstick LARGE (*)    All other components within normal limits  URINE MICROSCOPIC-ADD ON - Abnormal; Notable for the following:    Squamous Epithelial / LPF 0-5 (*)    Bacteria, UA RARE (*)    All other components within normal limits  CBC WITH DIFFERENTIAL/PLATELET    Imaging Review Ct Renal Stone Study  08/01/2015  CLINICAL DATA:  Woke up at 3:15 a.m. with acute RIGHT flank pain radiating to the anterior abdomen. EXAM: CT ABDOMEN AND PELVIS WITHOUT CONTRAST TECHNIQUE: Multidetector CT imaging of the abdomen and pelvis was performed following the standard protocol without IV contrast. COMPARISON:  None. FINDINGS: LUNG BASES: Lingular and LEFT lower lobe atelectasis/ scarring. Included heart size is normal. No pericardial effusions. KIDNEYS/BLADDER: Kidneys are orthotopic, RIGHT kidney is mildly atrophic and scarred. 2 mm RIGHT lower pole, 2 mm RIGHT interpolar, punctate RIGHT upper pole nephrolithiasis. Mild RIGHT hydroureteronephrosis the level of the distal ureter where a 2 mm calculus is present ; limited assessment for renal masses on this nonenhanced examination. Urinary bladder is partially distended and unremarkable. SOLID ORGANS: The liver, spleen, pancreas and adrenal glands are unremarkable for this non-contrast examination.  Status postcholecystectomy. GASTROINTESTINAL TRACT: The stomach, small and large bowel are normal in course and caliber without inflammatory changes, the sensitivity may be decreased by lack of enteric contrast. Normal appendix. PERITONEUM/RETROPERITONEUM: Aortoiliac vessels are normal in course and caliber. No lymphadenopathy by CT size criteria. Prostate size is normal. No intraperitoneal free fluid nor free air. SOFT TISSUES/ OSSEOUS STRUCTURES: Nonsuspicious. Small bilateral fat containing inguinal hernias. Grade 1 L5-S1 anterolisthesis without spondylolysis. Facet arthropathy results in severe RIGHT L5-S1 neural foraminal narrowing. IMPRESSION: 2 mm distal RIGHT ureteral calculus results in mild obstructive uropathy. Mildly atrophic and scarred RIGHT kidney with multiple RIGHT nephrolithiasis measuring up to 2 mm. Normal appendix. Electronically Signed   By: Awilda Metro M.D.   On: 08/01/2015 05:23   I have personally reviewed and evaluated these images and lab results as part of my medical decision-making.   EKG Interpretation None      MDM   Final diagnoses:  Ureteral colic    Pt comes in with cc of  R flank pain. Clinical concerns for renal colic, confirmed with a CT scan. No hx of same. Toradol x 2 ordered iv, and fentanyl.  Still having moderate to severe pain - so we will give him 1 mg dilaudid and sign out to Dr. Fayrene Fearing. Pt has mild hydroureteronephrosis.     Derwood Kaplan, MD 08/01/15 [phone]

## 2015-08-01 NOTE — ED Notes (Signed)
Pt reports that the  Dilaudid helped "a whole lot, but now, the pain is returning."  Will notify Primary RN and EDP.

## 2015-08-02 ENCOUNTER — Other Ambulatory Visit: Payer: BLUE CROSS/BLUE SHIELD

## 2015-08-03 ENCOUNTER — Ambulatory Visit
Admission: RE | Admit: 2015-08-03 | Discharge: 2015-08-03 | Disposition: A | Discharge status: Home / Self Care | Payer: BLUE CROSS/BLUE SHIELD | Source: Ambulatory Visit | Attending: Neurology | Admitting: Neurology

## 2015-08-03 ENCOUNTER — Telehealth: Payer: Self-pay

## 2015-08-03 DIAGNOSIS — G459 Transient cerebral ischemic attack, unspecified: Secondary | ICD-10-CM

## 2015-08-03 MED ORDER — DIAZEPAM 5 MG PO TABS: 5.0000 mg | ORAL_TABLET | Freq: Once | ORAL | Status: DC

## 2015-08-03 NOTE — Telephone Encounter (Signed)
Pt was scheduled for MRI today. Had panic attack and could not go through with scan. Wife would like to know if you can write something for him to get through scan. Please advise.

## 2015-08-03 NOTE — Telephone Encounter (Signed)
Valium  30 minute prior to exam.  Wife should drive him

## 2015-08-03 NOTE — Telephone Encounter (Signed)
RX submitted. Pt aware. Exam rescheduled for 08/13/15.

## 2015-08-09 ENCOUNTER — Telehealth: Payer: Self-pay | Admitting: Family Medicine

## 2015-08-09 NOTE — Telephone Encounter (Signed)
Called pt reached vm.  [facility] needs er follow up visit.

## 2015-08-10 ENCOUNTER — Telehealth: Payer: Self-pay | Admitting: Family Medicine

## 2015-08-10 NOTE — Telephone Encounter (Signed)
Pt called stating that he received message to call to schedule ER fu however pt wanted to let Dr Susann GivensLalonde know that the issue he went to ER for has completely resolved now. He had a kidney stone and pt already saw a urologist Monday 08/08/15 to f/u and pt has passed the kidney stone. Pt did not make a f/u appt with Dr Susann GivensLalonde.

## 2015-08-13 ENCOUNTER — Ambulatory Visit
Admission: RE | Admit: 2015-08-13 | Discharge: 2015-08-13 | Disposition: A | Discharge status: Home / Self Care | Payer: BLUE CROSS/BLUE SHIELD | Source: Ambulatory Visit | Attending: Neurology | Admitting: Neurology

## 2015-08-15 ENCOUNTER — Telehealth: Payer: Self-pay

## 2015-08-15 MED ORDER — ATORVASTATIN CALCIUM 80 MG PO TABS: 80.0000 mg | ORAL_TABLET | Freq: Every day | ORAL | Status: DC

## 2015-08-15 NOTE — Telephone Encounter (Signed)
Left message on machine for pt to return call to the office.  

## 2015-08-15 NOTE — Telephone Encounter (Signed)
Message relayed to patient. Verbalized understanding and denied questions. Medication sent in. Reminder set for Lipid.

## 2015-08-15 NOTE — Telephone Encounter (Signed)
-----   Message from Drema DallasAdam R Jaffe, DO sent at 08/15/2015  8:19 AM EDT ----- MRI of the brain does show a stroke.  I would therefore like to switch is cholesterol-lowering drug from simvastatin to atorvastatin 80mg  daily.  I would like to recheck fasting lipid panel in 3 months with follow up soon afterwards (reschedule follow up)

## 2015-10-24 ENCOUNTER — Telehealth: Payer: Self-pay

## 2015-10-24 DIAGNOSIS — E785 Hyperlipidemia, unspecified: Secondary | ICD-10-CM

## 2015-10-24 NOTE — Telephone Encounter (Signed)
Spoke w/ pt he will come by this week to have blood drawn.

## 2015-10-24 NOTE — Telephone Encounter (Signed)
-----   Message from Richarda OverlieJada A [place], [place] sent at 07/26/2015  9:24 AM EST ----- Regarding: LIpid Lipid panel needed

## 2015-10-24 NOTE — Telephone Encounter (Signed)
Left message on machine for pt to return call to the office.  

## 2015-10-25 ENCOUNTER — Other Ambulatory Visit (INDEPENDENT_AMBULATORY_CARE_PROVIDER_SITE_OTHER): Payer: BLUE CROSS/BLUE SHIELD

## 2015-10-25 DIAGNOSIS — E785 Hyperlipidemia, unspecified: Secondary | ICD-10-CM | POA: Diagnosis not present

## 2015-10-25 LAB — LIPID PANEL
CHOLESTEROL: 115 mg/dL (ref 0–200)
HDL: 29.6 mg/dL — ABNORMAL LOW (ref >39.00)
LDL Cholesterol: 75 mg/dL (ref 0–99)
NonHDL: 85.65
Total CHOL/HDL Ratio: 4
Triglycerides: 51 mg/dL (ref 0.0–149.0)
VLDL: 10.2 mg/dL (ref 0.0–40.0)

## 2015-10-26 ENCOUNTER — Telehealth: Payer: Self-pay

## 2015-10-26 MED ORDER — SIMVASTATIN 20 MG PO TABS: 20.0000 mg | ORAL_TABLET | Freq: Every day | ORAL | Status: DC

## 2015-10-26 NOTE — Telephone Encounter (Signed)
Phillip Ortega 12-17-2060. He was returning your call. His number is [phone]. Thank you

## 2015-10-26 NOTE — Telephone Encounter (Signed)
Relayed message to patient. New RX sent to pharmacy. Will take 2 10 mg tablets daily until he picks up new RX. Reminder set for 3 month repeat lipid.

## 2015-10-26 NOTE — Telephone Encounter (Signed)
-----   Message from Drema DallasAdam R Jaffe, DO sent at 10/26/2015  9:49 AM EDT ----- LDL is 75.  I would like it less than 70.  I recommend increasing simvastatin from 10mg  to 20mg  daily and repeat in 3 months.

## 2015-10-26 NOTE — Telephone Encounter (Signed)
Left message on machine for pt to return call to the office.  

## 2015-10-28 NOTE — Telephone Encounter (Addendum)
Pt called back. After going to review his medicine pt realized he has been taking atorvastatin 80 mg and not taking simvastatin at all. How would you like him to take medication going forward? Pt aware he will not get a call back til Wednesday when provider returns. Please advise.

## 2015-10-28 NOTE — Telephone Encounter (Signed)
That is largest dose of atorvastatin.  His LDL is only 75 and goal is under 70.  My suggestion would be to try to work it down via diet and recheck in 3-4 months

## 2015-11-07 ENCOUNTER — Other Ambulatory Visit: Payer: Self-pay | Admitting: Orthopedic Surgery

## 2015-11-07 DIAGNOSIS — M61022 Myositis ossificans traumatica, left upper arm: Secondary | ICD-10-CM

## 2015-11-11 ENCOUNTER — Other Ambulatory Visit: Payer: Self-pay | Admitting: Orthopedic Surgery

## 2015-11-11 DIAGNOSIS — M61022 Myositis ossificans traumatica, left upper arm: Secondary | ICD-10-CM

## 2015-11-14 ENCOUNTER — Ambulatory Visit
Admission: RE | Admit: 2015-11-14 | Discharge: 2015-11-14 | Disposition: A | Discharge status: Home / Self Care | Payer: BLUE CROSS/BLUE SHIELD | Source: Ambulatory Visit | Attending: Orthopedic Surgery | Admitting: Orthopedic Surgery

## 2015-11-14 DIAGNOSIS — M61022 Myositis ossificans traumatica, left upper arm: Secondary | ICD-10-CM

## 2015-11-16 ENCOUNTER — Ambulatory Visit (INDEPENDENT_AMBULATORY_CARE_PROVIDER_SITE_OTHER): Payer: BLUE CROSS/BLUE SHIELD | Admitting: Family Medicine

## 2015-11-16 VITALS — BP 122/70 | HR 75 | Wt 263.0 lb

## 2015-11-16 DIAGNOSIS — R112 Nausea with vomiting, unspecified: Secondary | ICD-10-CM

## 2015-11-16 NOTE — Progress Notes (Signed)
   Subjective:    Patient ID: Phillip Ortega, male    DOB: [birth year], 55 y.o.   MRN: [id number]  HPI  He is here for evaluation of 4 or 5 episodes over the last 3 weeks of nausea followed by vomiting on several occasions as well as gas. No diarrhea and no abdominal pain. Bowel habits are normal. He cannot relate the nausea to eating. He has been on no new medications. No previous history of stomach difficulties. He has a previous history of gallbladder trouble and subsequent surgery   Review of Systems     Objective:   Physical Exam Alert and in no distress. Abdominal exam shows decreased bowel sounds without masses or tenderness.       Assessment & Plan:  Nausea and vomiting, intractability of vomiting not specified, unspecified vomiting type ) Prilosec 40 mg daily for the next 10-14 days. If continued difficulty he is to call me. Hopefully this will take care of it.

## 2015-11-16 NOTE — Patient Instructions (Signed)
Take 2 Prilosec daily for the next 10-14 days. If the symptoms are gone and then were home free if you're still having trouble been call. If you can notice any pattern with the nausea

## 2015-11-18 ENCOUNTER — Ambulatory Visit (INDEPENDENT_AMBULATORY_CARE_PROVIDER_SITE_OTHER): Payer: BLUE CROSS/BLUE SHIELD | Admitting: Neurology

## 2015-11-18 ENCOUNTER — Encounter: Payer: Self-pay | Admitting: Neurology

## 2015-11-18 ENCOUNTER — Other Ambulatory Visit (HOSPITAL_COMMUNITY): Payer: Self-pay

## 2015-11-18 ENCOUNTER — Telehealth: Payer: Self-pay

## 2015-11-18 ENCOUNTER — Encounter: Payer: Self-pay | Admitting: Nurse Practitioner

## 2015-11-18 ENCOUNTER — Telehealth: Payer: Self-pay | Admitting: Nurse Practitioner

## 2015-11-18 VITALS — BP 168/90 | HR 81 | Ht 73.0 in | Wt 264.0 lb

## 2015-11-18 DIAGNOSIS — I63411 Cerebral infarction due to embolism of right middle cerebral artery: Secondary | ICD-10-CM

## 2015-11-18 NOTE — Telephone Encounter (Signed)
[facility] patient be scheduled by our office for a TEE per Dr. Everlena CooperJaffe for TIA.  I advised her that patient will need BMET and CBC prior to procedure and that I will call to schedule and call her back with the information.   I called and scheduled TEE with Meriam SpragueBeverly [facility] for Wednesday June 21 with Dr. Duke Salviaandolph @ 11:00.  I left pre-procedure instructions on voicemail and advised Jada to call back with questions or concerns.  I also left case # [id number] on the voicemail.

## 2015-11-18 NOTE — Patient Instructions (Signed)
1.  Continue aspirin 81mg  daily 2.  Blood pressure control 3.  Lipitor 80mg  daily.  Recheck lipid panel in 3 months 4.  WIll check CTA of head and transesophageal echocardiogram 5.  Routine exercise and Mediterranean diet    Why follow it? Research shows. . Those who follow the Mediterranean diet have a reduced risk of heart disease  . The diet is associated with a reduced incidence of Parkinson's and Alzheimer's diseases . People following the diet may have longer life expectancies and lower rates of chronic diseases  . The Dietary Guidelines for Americans recommends the Mediterranean diet as an eating plan to promote health and prevent disease  What Is the Mediterranean Diet?  . Healthy eating plan based on typical foods and recipes of Mediterranean-style cooking . The diet is primarily a plant based diet; these foods should make up a majority of meals   Starches - Plant based foods should make up a majority of meals - They are an important sources of vitamins, minerals, energy, antioxidants, and fiber - Choose whole grains, foods high in fiber and minimally processed items  - Typical grain sources include wheat, oats, barley, corn, brown rice, bulgar, farro, millet, polenta, couscous  - Various types of beans include chickpeas, lentils, fava beans, black beans, white beans   Fruits  Veggies - Large quantities of antioxidant rich fruits & veggies; 6 or more servings  - Vegetables can be eaten raw or lightly drizzled with oil and cooked  - Vegetables common to the traditional Mediterranean Diet include: artichokes, arugula, beets, broccoli, brussel sprouts, cabbage, carrots, celery, collard greens, cucumbers, eggplant, kale, leeks, lemons, lettuce, mushrooms, okra, onions, peas, peppers, potatoes, pumpkin, radishes, rutabaga, shallots, spinach, sweet potatoes, turnips, zucchini - Fruits common to the Mediterranean Diet include: apples, apricots, avocados, cherries, clementines, dates, figs,  grapefruits, grapes, melons, nectarines, oranges, peaches, pears, pomegranates, strawberries, tangerines  Fats - Replace butter and margarine with healthy oils, such as olive oil, canola oil, and tahini  - Limit nuts to no more than a handful a day  - Nuts include walnuts, almonds, pecans, pistachios, pine nuts  - Limit or avoid candied, honey roasted or heavily salted nuts - Olives are central to the [place]iterranean diet - can be eaten whole or used in a variety of dishes   Meats Protein - Limiting red meat: no more than a few times a month - When eating red meat: choose lean cuts and keep the portion to the size of deck of cards - Eggs: approx. 0 to 4 times a week  - Fish and lean poultry: at least 2 a week  - Healthy protein sources include, chicken, [place], lean beef, lamb - Increase intake of seafood such as tuna, salmon, trout, mackerel, shrimp, scallops - Avoid or limit high fat processed meats such as sausage and bacon  Dairy - Include moderate amounts of low fat dairy products  - Focus on healthy dairy such as fat free yogurt, skim milk, low or reduced fat cheese - Limit dairy products higher in fat such as whole or 2% milk, cheese, ice cream  Alcohol - Moderate amounts of red wine is ok  - No more than 5 oz daily for women (all ages) and men older than age 54  - No more than 10 oz of wine daily for men younger than 10965  Other - Limit sweets and other desserts  - Use herbs and spices instead of salt to flavor foods  - Herbs and spices  common to the traditional Mediterranean Diet include: basil, bay leaves, chives, cloves, cumin, fennel, garlic, lavender, marjoram, mint, oregano, parsley, pepper, rosemary, sage, savory, sumac, tarragon, thyme   It's not just a diet, it's a lifestyle:  . The Mediterranean diet includes lifestyle factors typical of those in the region  . Foods, drinks and meals are best eaten with others and savored . Daily physical activity is important for overall good  health . This could be strenuous exercise like running and aerobics . This could also be more leisurely activities such as walking, housework, yard-work, or taking the stairs . Moderation is the key; a balanced and healthy diet accommodates most foods and drinks . Consider portion sizes and frequency of consumption of certain foods   Meal Ideas & Options:  . Breakfast:  o Whole wheat toast or whole wheat English muffins with peanut butter & hard boiled egg o Steel cut oats topped with apples & cinnamon and skim milk  o Fresh fruit: banana, strawberries, melon, berries, peaches  o Smoothies: strawberries, bananas, greek yogurt, peanut butter o Low fat greek yogurt with blueberries and granola  o Egg white omelet with spinach and mushrooms o Breakfast couscous: whole wheat couscous, apricots, skim milk, cranberries  . Sandwiches:  o Hummus and grilled vegetables (peppers, zucchini, squash) on whole wheat bread   o Grilled chicken on whole wheat pita with lettuce, tomatoes, cucumbers or tzatziki  o Tuna salad on whole wheat bread: tuna salad made with greek yogurt, olives, red peppers, capers, green onions o Garlic rosemary lamb pita: lamb sauted with garlic, rosemary, salt & pepper; add lettuce, cucumber, greek yogurt to pita - flavor with lemon juice and black pepper  . Seafood:  o Mediterranean grilled salmon, seasoned with garlic, basil, parsley, lemon juice and black pepper o Shrimp, lemon, and spinach whole-grain pasta salad made with low fat greek yogurt  o Seared scallops with lemon orzo  o Seared tuna steaks seasoned salt, pepper, coriander topped with tomato mixture of olives, tomatoes, olive oil, minced garlic, parsley, green onions and cappers  . Meats:  o Herbed greek chicken salad with kalamata olives, cucumber, feta  o Red bell peppers stuffed with spinach, bulgur, lean ground beef (or lentils) & topped with feta   o Kebabs: skewers of chicken, tomatoes, onions, zucchini,  squash  o [place] burgers: made with red onions, mint, dill, lemon juice, feta cheese topped with roasted red peppers . Vegetarian o Cucumber salad: cucumbers, artichoke hearts, celery, red onion, feta cheese, tossed in olive oil & lemon juice  o Hummus and whole grain pita points with a greek salad (lettuce, tomato, feta, olives, cucumbers, red onion) o Lentil soup with celery, carrots made with vegetable broth, garlic, salt and pepper  o Tabouli salad: parsley, bulgur, mint, scallions, cucumbers, tomato, radishes, lemon juice, olive oil, salt and pepper. 6.  Follow up in 6 months.

## 2015-11-18 NOTE — Progress Notes (Signed)
NEUROLOGY FOLLOW UP OFFICE NOTE  Marzetta BoardDavid W Kasper [id number]  HISTORY OF PRESENT ILLNESS: Phillip Ortega is a 55 year old right-handed male with hypertension who follows up for stroke.  UPDATE: MRI of brain from 08/13/15 was personally reviewed and revealed small to moderate-sized subacute right MCA infarct with associated petechial hemorrhage as well as small subacute infarct in the posterior right external capsule.  He was switched to Lipitor 80mg  daily.  Repeat LDL from 10/25/15 was 75.  He has been well.  HISTORY: He was admitted to [facility] on 06/18/15 when he tripped and fell, fracturing and dislocating his left elbow.  He is not sure what happened but suddenly lost control of his body.  He did not hit his head or lose consciousness.  In the ED, he was noted to have left sided facial, arm and leg weakness with slurred speech.  He did not have headache.  Symptoms lasted approximately 10 minutes.  Blood pressure was around 180s/88.  CT of head showed no acute infarct.  Telemetry revealed no A fib.  2D echo showed EF 55-60% with no cardiac source of emboli.  Carotid doppler showed no hemodynamically significant stenosis.  Serum glucose was 156.  LDL was 108.  He was discharged on ASA 325mg  daily and started on antihypertensive medication.  He reports having a brief sensation of "loss of control" of his body about a year ago, in which he fell.  He denied any weakness at that time.  He denies history of head trauma or seizures.  He had migraines as a child.  PAST MEDICAL HISTORY: Past Medical History  Diagnosis Date  . Hypertension   . Allergy     rhinitis  . TIA (transient ischemic attack)   . Dislocation of left elbow 06/27/2015    MEDICATIONS: Current Outpatient Prescriptions on File Prior to Visit  Medication Sig Dispense Refill  . carvedilol (COREG) 6.25 MG tablet Take 6.25 mg by mouth 2 (two) times daily with a meal. Reported on 11/16/2015    . cetirizine (ZYRTEC) 10 MG  tablet Take 1 tablet (10 mg total) by mouth daily. (Patient taking differently: Take 10 mg by mouth daily as needed for allergies. ) 30 tablet 2  . FLUoxetine (PROZAC) 20 MG capsule Take 20 mg by mouth daily.    Marland Kitchen. ibuprofen (ADVIL,MOTRIN) 800 MG tablet Take 1 tablet (800 mg total) by mouth 3 (three) times daily. 21 tablet 0  . simvastatin (ZOCOR) 20 MG tablet Take 1 tablet (20 mg total) by mouth daily. 30 tablet 3  . tamsulosin (FLOMAX) 0.4 MG CAPS capsule Take 1 capsule (0.4 mg total) by mouth daily. 7 capsule 0  . buPROPion (WELLBUTRIN) 100 MG tablet Take 1 tablet (100 mg total) by mouth 1 day or 1 dose. 30 tablet 1   Current Facility-Administered Medications on File Prior to Visit  Medication Dose Route Frequency Provider Last Rate Last Dose  . ipratropium-albuterol (DUONEB) 0.5-2.5 (3) MG/3ML nebulizer solution 3 mL  3 mL Nebulization Once Jac Canavanavid S Tysinger, PA-C        ALLERGIES: No Known Allergies  FAMILY HISTORY: Family History  Problem Relation Age of Onset  . Anemia Sister   . Migraines Mother   . Dementia Mother     SOCIAL HISTORY: Social History   Social History  . Marital Status: Married    Spouse Name: N/A  . Number of Children: N/A  . Years of Education: N/A   Occupational History  . Not  on file.   Social History Main Topics  . Smoking status: Never Smoker   . Smokeless tobacco: Never Used  . Alcohol Use: 0.0 oz/week    0 Standard drinks or equivalent per week     Comment: social  . Drug Use: No  . Sexual Activity:    Partners: Female   Other Topics Concern  . Not on file   Social History Narrative    REVIEW OF SYSTEMS: Constitutional: No fevers, chills, or sweats, no generalized fatigue, change in appetite Eyes: No visual changes, double vision, eye pain Ear, nose and throat: No hearing loss, ear pain, nasal congestion, sore throat Cardiovascular: No chest pain, palpitations Respiratory:  No shortness of breath at rest or with exertion,  wheezes GastrointestinaI: No nausea, vomiting, diarrhea, abdominal pain, fecal incontinence Genitourinary:  No dysuria, urinary retention or frequency Musculoskeletal:  No neck pain, back pain Integumentary: No rash, pruritus, skin lesions Neurological: as above Psychiatric: No depression, insomnia, anxiety Endocrine: No palpitations, fatigue, diaphoresis, mood swings, change in appetite, change in weight, increased thirst Hematologic/Lymphatic:  No purpura, petechiae. Allergic/Immunologic: no itchy/runny eyes, nasal congestion, recent allergic reactions, rashes  PHYSICAL EXAM: Filed Vitals:   11/18/15 1053  BP: 168/90  Pulse: 81   General: No acute distress.  Patient appears well-groomed.  Head:  Normocephalic/atraumatic Eyes:  Fundi examined but not visualized Neck: supple, no paraspinal tenderness, full range of motion Heart:  Regular rate and rhythm Lungs:  Clear to auscultation bilaterally Back: No paraspinal tenderness Neurological Exam: alert and oriented to person, place, and time. Attention span and concentration intact, recent and remote memory intact, fund of knowledge intact.  Speech fluent and not dysarthric, language intact.  CN II-XII intact. Bulk and tone normal, muscle strength 5/5 throughout.  Sensation to light touch, temperature and vibration intact.  Deep tendon reflexes 2+ throughout, toes downgoing.  Finger to nose and heel to shin testing intact.  Gait normal, Romberg negative.  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  Elevated today.  PLAN: 1.  ASA 81mg  daily 2.  Lipitor 80mg  daily.  Ideally, I would like it less than 70.  Will have him work on diet and exercise.  Recheck fasting lipid panel in 3 months.  Check LFTs. 3.  To complete workup, check CTA of head and TEE 4.  Optimize blood pressure control.  Follow up with PCP regarding blood pressure.  26 minutes spent face to face with patient, over 50% spent discussing diagnosis and  counseling.  Shon MilletAdam Jaffe, DO  CC:  Sharlot GowdaJohn Lalonde, MD

## 2015-11-18 NOTE — Telephone Encounter (Signed)
TEE Scheduled for 6/21 @ 9:30 @ [facility].

## 2015-11-21 ENCOUNTER — Telehealth: Payer: Self-pay | Admitting: Cardiovascular Disease

## 2015-11-21 NOTE — Telephone Encounter (Addendum)
Pt has a TEE 11-23-15 and wants to know if he can get it rescheduled to next week sometime??  pls call [phone]

## 2015-11-21 NOTE — Telephone Encounter (Signed)
Rescheduled TEE for 11/29/15 at 10:00 am with Dr Anne FuSkains as requested  Left message to call back

## 2015-11-21 NOTE — Telephone Encounter (Signed)
11-11-15 432pm -11-29-15 works-pls call with details [phone]

## 2015-11-21 NOTE — Telephone Encounter (Signed)
Information given to patient.   

## 2015-11-22 ENCOUNTER — Other Ambulatory Visit: Payer: Self-pay | Admitting: Neurology

## 2015-11-29 ENCOUNTER — Encounter (HOSPITAL_COMMUNITY): Payer: Self-pay

## 2015-11-29 ENCOUNTER — Ambulatory Visit (HOSPITAL_BASED_OUTPATIENT_CLINIC_OR_DEPARTMENT_OTHER)
Admission: RE | Admit: 2015-11-29 | Discharge: 2015-11-29 | Disposition: A | Discharge status: Home / Self Care | Payer: BLUE CROSS/BLUE SHIELD | Source: Ambulatory Visit | Attending: Cardiology | Admitting: Cardiology

## 2015-11-29 ENCOUNTER — Ambulatory Visit (HOSPITAL_COMMUNITY)
Admission: RE | Admit: 2015-11-29 | Discharge: 2015-11-29 | Disposition: A | Discharge status: Home / Self Care | Payer: BLUE CROSS/BLUE SHIELD | Source: Ambulatory Visit | Attending: Cardiovascular Disease | Admitting: Cardiovascular Disease

## 2015-11-29 ENCOUNTER — Encounter (HOSPITAL_COMMUNITY)
Admission: RE | Disposition: A | Discharge status: Home / Self Care | Payer: Self-pay | Source: Ambulatory Visit | Attending: Cardiovascular Disease

## 2015-11-29 DIAGNOSIS — Z8673 Personal history of transient ischemic attack (TIA), and cerebral infarction without residual deficits: Secondary | ICD-10-CM | POA: Diagnosis not present

## 2015-11-29 DIAGNOSIS — Z79899 Other long term (current) drug therapy: Secondary | ICD-10-CM | POA: Insufficient documentation

## 2015-11-29 DIAGNOSIS — G459 Transient cerebral ischemic attack, unspecified: Secondary | ICD-10-CM | POA: Insufficient documentation

## 2015-11-29 DIAGNOSIS — I639 Cerebral infarction, unspecified: Secondary | ICD-10-CM

## 2015-11-29 DIAGNOSIS — I10 Essential (primary) hypertension: Secondary | ICD-10-CM | POA: Insufficient documentation

## 2015-11-29 DIAGNOSIS — I340 Nonrheumatic mitral (valve) insufficiency: Secondary | ICD-10-CM

## 2015-11-29 HISTORY — PX: TEE WITHOUT CARDIOVERSION: SHX5443

## 2015-11-29 SURGERY — ECHOCARDIOGRAM, TRANSESOPHAGEAL
Anesthesia: Moderate Sedation

## 2015-11-29 MED ORDER — MIDAZOLAM HCL 5 MG/ML IJ SOLN
INTRAMUSCULAR | Status: AC
  Filled 2015-11-29: qty 2

## 2015-11-29 MED ORDER — MIDAZOLAM HCL 10 MG/2ML IJ SOLN
INTRAMUSCULAR | Status: DC | PRN
  Administered 2015-11-29 (×2): 2 mg via INTRAVENOUS

## 2015-11-29 MED ORDER — FENTANYL CITRATE (PF) 100 MCG/2ML IJ SOLN
INTRAMUSCULAR | Status: AC
  Filled 2015-11-29: qty 2

## 2015-11-29 MED ORDER — BUTAMBEN-TETRACAINE-BENZOCAINE 2-2-14 % EX AERO
INHALATION_SPRAY | CUTANEOUS | Status: DC | PRN
  Administered 2015-11-29: 2 via TOPICAL

## 2015-11-29 MED ORDER — FENTANYL CITRATE (PF) 100 MCG/2ML IJ SOLN
INTRAMUSCULAR | Status: DC | PRN
  Administered 2015-11-29: 50 ug via INTRAVENOUS

## 2015-11-29 MED ORDER — DIPHENHYDRAMINE HCL 50 MG/ML IJ SOLN
INTRAMUSCULAR | Status: AC
  Filled 2015-11-29: qty 1

## 2015-11-29 MED ORDER — SODIUM CHLORIDE 0.9 % IV SOLN
INTRAVENOUS | Status: DC
  Administered 2015-11-29: 500 mL via INTRAVENOUS

## 2015-11-29 NOTE — H&P (View-Only) (Signed)
 NEUROLOGY FOLLOW UP OFFICE NOTE  Sergi W Petrik [id number]  HISTORY OF PRESENT ILLNESS: Phillip Ortega is a 55 year old right-handed male with hypertension who follows up for stroke.  UPDATE: MRI of brain from 08/13/15 was personally reviewed and revealed small to moderate-sized subacute right MCA infarct with associated petechial hemorrhage as well as small subacute infarct in the posterior right external capsule.  He was switched to Lipitor 80mg daily.  Repeat LDL from 10/25/15 was 75.  He has been well.  HISTORY: He was admitted to [facility] on 06/18/15 when he tripped and fell, fracturing and dislocating his left elbow.  He is not sure what happened but suddenly lost control of his body.  He did not hit his head or lose consciousness.  In the ED, he was noted to have left sided facial, arm and leg weakness with slurred speech.  He did not have headache.  Symptoms lasted approximately 10 minutes.  Blood pressure was around 180s/88.  CT of head showed no acute infarct.  Telemetry revealed no A fib.  2D echo showed EF 55-60% with no cardiac source of emboli.  Carotid doppler showed no hemodynamically significant stenosis.  Serum glucose was 156.  LDL was 108.  He was discharged on ASA 325mg daily and started on antihypertensive medication.  He reports having a brief sensation of "loss of control" of his body about a year ago, in which he fell.  He denied any weakness at that time.  He denies history of head trauma or seizures.  He had migraines as a child.  PAST MEDICAL HISTORY: Past Medical History  Diagnosis Date  . Hypertension   . Allergy     rhinitis  . TIA (transient ischemic attack)   . Dislocation of left elbow 06/27/2015    MEDICATIONS: Current Outpatient Prescriptions on File Prior to Visit  Medication Sig Dispense Refill  . carvedilol (COREG) 6.25 MG tablet Take 6.25 mg by mouth 2 (two) times daily with a meal. Reported on 11/16/2015    . cetirizine (ZYRTEC) 10 MG  tablet Take 1 tablet (10 mg total) by mouth daily. (Patient taking differently: Take 10 mg by mouth daily as needed for allergies. ) 30 tablet 2  . FLUoxetine (PROZAC) 20 MG capsule Take 20 mg by mouth daily.    . ibuprofen (ADVIL,MOTRIN) 800 MG tablet Take 1 tablet (800 mg total) by mouth 3 (three) times daily. 21 tablet 0  . simvastatin (ZOCOR) 20 MG tablet Take 1 tablet (20 mg total) by mouth daily. 30 tablet 3  . tamsulosin (FLOMAX) 0.4 MG CAPS capsule Take 1 capsule (0.4 mg total) by mouth daily. 7 capsule 0  . buPROPion (WELLBUTRIN) 100 MG tablet Take 1 tablet (100 mg total) by mouth 1 day or 1 dose. 30 tablet 1   Current Facility-Administered Medications on File Prior to Visit  Medication Dose Route Frequency Provider Last Rate Last Dose  . ipratropium-albuterol (DUONEB) 0.5-2.5 (3) MG/3ML nebulizer solution 3 mL  3 mL Nebulization Once Destin S Tysinger, PA-C        ALLERGIES: No Known Allergies  FAMILY HISTORY: Family History  Problem Relation Age of Onset  . Anemia Sister   . Migraines Mother   . Dementia Mother     SOCIAL HISTORY: Social History   Social History  . Marital Status: Married    Spouse Name: N/A  . Number of Children: N/A  . Years of Education: N/A   Occupational History  . Not   on file.   Social History Main Topics  . Smoking status: Never Smoker   . Smokeless tobacco: Never Used  . Alcohol Use: 0.0 oz/week    0 Standard drinks or equivalent per week     Comment: social  . Drug Use: No  . Sexual Activity:    Partners: Female   Other Topics Concern  . Not on file   Social History Narrative    REVIEW OF SYSTEMS: Constitutional: No fevers, chills, or sweats, no generalized fatigue, change in appetite Eyes: No visual changes, double vision, eye pain Ear, nose and throat: No hearing loss, ear pain, nasal congestion, sore throat Cardiovascular: No chest pain, palpitations Respiratory:  No shortness of breath at rest or with exertion,  wheezes GastrointestinaI: No nausea, vomiting, diarrhea, abdominal pain, fecal incontinence Genitourinary:  No dysuria, urinary retention or frequency Musculoskeletal:  No neck pain, back pain Integumentary: No rash, pruritus, skin lesions Neurological: as above Psychiatric: No depression, insomnia, anxiety Endocrine: No palpitations, fatigue, diaphoresis, mood swings, change in appetite, change in weight, increased thirst Hematologic/Lymphatic:  No purpura, petechiae. Allergic/Immunologic: no itchy/runny eyes, nasal congestion, recent allergic reactions, rashes  PHYSICAL EXAM: Filed Vitals:   11/18/15 1053  BP: 168/90  Pulse: 81   General: No acute distress.  Patient appears well-groomed.  Head:  Normocephalic/atraumatic Eyes:  Fundi examined but not visualized Neck: supple, no paraspinal tenderness, full range of motion Heart:  Regular rate and rhythm Lungs:  Clear to auscultation bilaterally Back: No paraspinal tenderness Neurological Exam: alert and oriented to person, place, and time. Attention span and concentration intact, recent and remote memory intact, fund of knowledge intact.  Speech fluent and not dysarthric, language intact.  CN II-XII intact. Bulk and tone normal, muscle strength 5/5 throughout.  Sensation to light touch, temperature and vibration intact.  Deep tendon reflexes 2+ throughout, toes downgoing.  Finger to nose and heel to shin testing intact.  Gait normal, Romberg negative.  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  Elevated today.  PLAN: 1.  ASA 81mg  daily 2.  Lipitor 80mg  daily.  Ideally, I would like it less than 70.  Will have him work on diet and exercise.  Recheck fasting lipid panel in 3 months.  Check LFTs. 3.  To complete workup, check CTA of head and TEE 4.  Optimize blood pressure control.  Follow up with PCP regarding blood pressure.  26 minutes spent face to face with patient, over 50% spent discussing diagnosis and  counseling.  Shon MilletAdam Tameca Jerez, DO  CC:  Sharlot GowdaJohn Lalonde, MD

## 2015-11-29 NOTE — Discharge Instructions (Signed)

## 2015-11-29 NOTE — CV Procedure (Signed)
  TEE - stroke Normal EF Mild MR Normal bubble study  Donato SchultzMark Davionne Dowty, MD

## 2015-11-29 NOTE — Progress Notes (Signed)
Echocardiogram 2D Echocardiogram has been performed.  Dorothey BasemanReel, Josh Nicolosi M 11/29/2015, 11:04 AM

## 2015-11-29 NOTE — Interval H&P Note (Signed)
History and Physical Interval Note:  11/29/2015 9:06 AM  Phillip Ortega  has presented today for surgery, with the diagnosis of TIA  The various methods of treatment have been discussed with the patient and family. After consideration of risks, benefits and other options for treatment, the patient has consented to  Procedure(s): TRANSESOPHAGEAL ECHOCARDIOGRAM (TEE) (N/A) as a surgical intervention .  The patient's history has been reviewed, patient examined, no change in status, stable for surgery.  I have reviewed the patient's chart and labs.  Questions were answered to the patient's satisfaction.     Coca ColaMark Bellamy Rubey

## 2015-12-09 ENCOUNTER — Other Ambulatory Visit: Payer: Self-pay | Admitting: Neurology

## 2015-12-13 ENCOUNTER — Telehealth: Payer: Self-pay

## 2015-12-13 ENCOUNTER — Ambulatory Visit
Admission: RE | Admit: 2015-12-13 | Discharge: 2015-12-13 | Disposition: A | Discharge status: Home / Self Care | Payer: BLUE CROSS/BLUE SHIELD | Source: Ambulatory Visit | Attending: Neurology | Admitting: Neurology

## 2015-12-13 DIAGNOSIS — I63411 Cerebral infarction due to embolism of right middle cerebral artery: Secondary | ICD-10-CM

## 2015-12-13 MED ORDER — IOPAMIDOL (ISOVUE-370) INJECTION 76%
100.0000 mL | Freq: Once | INTRAVENOUS | Status: AC | PRN
  Administered 2015-12-13: 100 mL via INTRAVENOUS

## 2015-12-13 NOTE — Telephone Encounter (Signed)
TEE looked fine.  We will just continue current management.  No changes.

## 2015-12-13 NOTE — Telephone Encounter (Addendum)
Message relayed to patient. Verbalized understanding. Pt would also like results from recent TEE, stated when he was having procedure done he was told everything looked fine, but had not received final results. Pt would also like to know if TEE is normal as he suspects, what are his next steps. Please advise.

## 2015-12-13 NOTE — Telephone Encounter (Signed)
-----   Message from Drema DallasAdam R Jaffe, DO sent at 12/13/2015 12:06 PM EDT ----- CTA is normal.

## 2015-12-13 NOTE — Telephone Encounter (Signed)
Left message for pt to call back  °

## 2015-12-14 NOTE — Telephone Encounter (Signed)
2nd message left.

## 2015-12-14 NOTE — Telephone Encounter (Signed)
Left message for pt to call office

## 2016-01-12 NOTE — Pre-Procedure Instructions (Signed)
Phillip ClimesDavid W Ortega  01/12/2016     Your procedure is scheduled on :  Friday January 20, 2016 at 9:30 AM.  [facility] Ad[facility] at 7:30 AM.  Call this number if you have problems the morning of surgery: [phone]    Remember:  Do not eat food or drink liquids after midnight.  Take these medicines the morning of surgery with A SIP OF WATER : Bupropion (Wellbutrin), Carvedilol (Coreg), Cetirizine (Zyrtec) if needed, Fluoxetine (Prozac), Omeprazole (Prilosec),Tamsulosin (Flomax)   Stop taking any vitamins, herbal medications, NSAIDs, Ibuprofen, Advil, Motrin,Aleve, etc today    Do not wear jewelry.  Do not wear lotions, powders, or cologne.    Men may shave face and neck.  Do not bring valuables to the hospital.  [facility] is not responsible for any belongings or valuables.  Contacts, dentures or bridgework may not be worn into surgery.  Leave your suitcase in the car.  After surgery it may be brought to your room.  For patients admitted to the hospital, discharge time will be determined by your treatment team.  Patients discharged the day of surgery will not be allowed to drive home.   Name and phone number of your driver:    Special instructions:  Shower using CHG soap the night before and the morning of your surgery  Please read over the following fact sheets that you were given. Surgical Site Infection Prevention

## 2016-01-13 ENCOUNTER — Encounter (HOSPITAL_COMMUNITY): Payer: Self-pay

## 2016-01-13 ENCOUNTER — Encounter (HOSPITAL_COMMUNITY)
Admission: RE | Admit: 2016-01-13 | Discharge: 2016-01-13 | Disposition: A | Discharge status: Home / Self Care | Payer: BLUE CROSS/BLUE SHIELD | Source: Ambulatory Visit | Attending: Orthopedic Surgery | Admitting: Orthopedic Surgery

## 2016-01-13 ENCOUNTER — Telehealth: Payer: Self-pay

## 2016-01-13 DIAGNOSIS — Z01812 Encounter for preprocedural laboratory examination: Secondary | ICD-10-CM | POA: Diagnosis not present

## 2016-01-13 DIAGNOSIS — M899 Disorder of bone, unspecified: Secondary | ICD-10-CM | POA: Diagnosis not present

## 2016-01-13 DIAGNOSIS — I10 Essential (primary) hypertension: Secondary | ICD-10-CM | POA: Insufficient documentation

## 2016-01-13 DIAGNOSIS — R9431 Abnormal electrocardiogram [ECG] [EKG]: Secondary | ICD-10-CM | POA: Insufficient documentation

## 2016-01-13 DIAGNOSIS — Z01818 Encounter for other preprocedural examination: Secondary | ICD-10-CM | POA: Diagnosis not present

## 2016-01-13 DIAGNOSIS — Z0183 Encounter for blood typing: Secondary | ICD-10-CM | POA: Insufficient documentation

## 2016-01-13 HISTORY — DX: Personal history of urinary calculi: Z87.442

## 2016-01-13 HISTORY — DX: Other specified postprocedural states: Z98.890

## 2016-01-13 HISTORY — DX: Major depressive disorder, single episode, unspecified: F32.9

## 2016-01-13 HISTORY — DX: Gastro-esophageal reflux disease without esophagitis: K21.9

## 2016-01-13 HISTORY — DX: Depression, unspecified: F32.A

## 2016-01-13 HISTORY — DX: Cerebral infarction, unspecified: I63.9

## 2016-01-13 HISTORY — DX: Nausea with vomiting, unspecified: R11.2

## 2016-01-13 HISTORY — DX: Anxiety disorder, unspecified: F41.9

## 2016-01-13 LAB — COMPREHENSIVE METABOLIC PANEL
ALBUMIN: 3.6 g/dL (ref 3.5–5.0)
ALT: 24 U/L (ref 17–63)
AST: 22 U/L (ref 15–41)
Alkaline Phosphatase: 63 U/L (ref 38–126)
Anion gap: 8 (ref 5–15)
BILIRUBIN TOTAL: 0.5 mg/dL (ref 0.3–1.2)
BUN: 14 mg/dL (ref 6–20)
CO2: 22 mmol/L (ref 22–32)
CREATININE: 0.91 mg/dL (ref 0.61–1.24)
Calcium: 9 mg/dL (ref 8.9–10.3)
Chloride: 108 mmol/L (ref 101–111)
GFR calc Af Amer: >60 mL/min (ref >60)
GLUCOSE: 118 mg/dL — AB (ref 65–99)
Potassium: 3.9 mmol/L (ref 3.5–5.1)
Sodium: 138 mmol/L (ref 135–145)
TOTAL PROTEIN: 7 g/dL (ref 6.5–8.1)

## 2016-01-13 LAB — CBC WITH DIFFERENTIAL/PLATELET
BASOS ABS: 0.1 10*3/uL (ref 0.0–0.1)
BASOS PCT: 1 %
EOS PCT: 6 %
Eosinophils Absolute: 0.4 10*3/uL (ref 0.0–0.7)
HCT: 45.6 % (ref 39.0–52.0)
Hemoglobin: 15 g/dL (ref 13.0–17.0)
Lymphocytes Relative: 30 %
Lymphs Abs: 2.2 10*3/uL (ref 0.7–4.0)
MCH: 28.2 pg (ref 26.0–34.0)
MCHC: 32.9 g/dL (ref 30.0–36.0)
MCV: 85.9 fL (ref 78.0–100.0)
MONO ABS: 0.5 10*3/uL (ref 0.1–1.0)
Monocytes Relative: 7 %
NEUTROS ABS: 4.3 10*3/uL (ref 1.7–7.7)
Neutrophils Relative %: 56 %
PLATELETS: 242 10*3/uL (ref 150–400)
RBC: 5.31 MIL/uL (ref 4.22–5.81)
RDW: 14.4 % (ref 11.5–15.5)
WBC: 7.4 10*3/uL (ref 4.0–10.5)

## 2016-01-13 LAB — TYPE AND SCREEN
ABO/RH(D): A POS
ANTIBODY SCREEN: NEGATIVE

## 2016-01-13 LAB — APTT: APTT: 25 s (ref 24–36)

## 2016-01-13 LAB — PROTIME-INR
INR: 0.94
PROTHROMBIN TIME: 12.5 s (ref 11.4–15.2)

## 2016-01-13 LAB — ABO/RH: ABO/RH(D): A POS

## 2016-01-13 NOTE — Progress Notes (Signed)
PCP is Sharlot GowdaJohn Ortega  Neurologist is Shon MilletAdam Jaffe  Patient arrived to PAT accompanied by his daughter.  Patient denied having any acute cardiac or pulmonary issues. Patient informed Nurse that he no longer takes any cardiac medications, and he denied having a stress test, cardiac cath, or sleep study.   Patient did inform Nurse that he had a CVA in January 2017. Patient denied having any deficits from it.

## 2016-01-13 NOTE — Telephone Encounter (Signed)
Called pt to please make appointment to discuss having a sleep study

## 2016-01-13 NOTE — Progress Notes (Signed)
Nurse called patient to inquire about scheduled 0815 PAT appointment. No answer. Voicemail left instructing patient to return call at his earliest convenience. Direct call back number left.

## 2016-01-13 NOTE — Progress Notes (Signed)
   01/13/16 0855  OBSTRUCTIVE SLEEP APNEA  Have you ever been diagnosed with sleep apnea through a sleep study? No  Do you snore loudly (loud enough to be heard through closed doors)?  1  Do you often feel tired, fatigued, or sleepy during the daytime (such as falling asleep during driving or talking to someone)? 1  Has anyone observed you stop breathing during your sleep? 0  Do you have, or are you being treated for high blood pressure? 1  BMI more than 35 kg/m2? 0  Age > 50 (1-yes) 1  Neck circumference greater than:Male 16 inches or larger, Male 17inches or larger? 1  Male Gender (Yes=1) 1  Obstructive Sleep Apnea Score 6  Score 5 or greater  Results sent to PCP   This patient has screened at risk for sleep apnea using the STOP Bang tool used during a pre-surgical visit. A score of 5 or greater is at risk for sleep apnea.

## 2016-01-16 ENCOUNTER — Encounter: Payer: Self-pay | Admitting: Family Medicine

## 2016-01-16 ENCOUNTER — Ambulatory Visit (INDEPENDENT_AMBULATORY_CARE_PROVIDER_SITE_OTHER): Payer: BLUE CROSS/BLUE SHIELD | Admitting: Family Medicine

## 2016-01-16 VITALS — BP 126/80 | HR 68 | Wt 286.0 lb

## 2016-01-16 DIAGNOSIS — Z9189 Other specified personal risk factors, not elsewhere classified: Secondary | ICD-10-CM | POA: Diagnosis not present

## 2016-01-16 NOTE — Progress Notes (Signed)
   Subjective:    Patient ID: Phillip Ortega, male    DOB: 10-04-1960, 55 y.o.   MRN: [id number]  HPI He is here for consult concerning possible sleep apnea. He was evaluated prior to a surgical procedure hand information suggested possible sleep apnea. He does admit over the last several months to being quite fatigued and falling asleep easily. Over the last several years he has gained a significant amount of weight.   Review of Systems     Objective:   Physical Exam Alert and in no distress. Epworth Sleepiness Scale recorded at 10       Assessment & Plan:  At risk for obstructive sleep apnea - Plan: Home sleep test Discussed sleep apnea with him and the need for further testing. We will attempt to set him up for home sleep study.

## 2016-01-17 NOTE — Addendum Note (Signed)
Addended by: Herminio CommonsJOHNSON, SABRINA A on: 01/17/2016 09:07 AM   Modules accepted: Orders

## 2016-01-19 MED ORDER — DEXTROSE 5 % IV SOLN
3.0000 g | INTRAVENOUS | Status: AC
  Administered 2016-01-20: 3 g via INTRAVENOUS
  Filled 2016-01-19: qty 3000

## 2016-01-20 ENCOUNTER — Encounter (HOSPITAL_COMMUNITY)
Admission: RE | Disposition: A | Discharge status: Home / Self Care | Payer: Self-pay | Source: Ambulatory Visit | Attending: Orthopedic Surgery

## 2016-01-20 ENCOUNTER — Encounter (HOSPITAL_COMMUNITY): Payer: Self-pay | Admitting: Surgery

## 2016-01-20 ENCOUNTER — Inpatient Hospital Stay (HOSPITAL_COMMUNITY): Payer: BLUE CROSS/BLUE SHIELD | Admitting: Anesthesiology

## 2016-01-20 ENCOUNTER — Inpatient Hospital Stay (HOSPITAL_COMMUNITY): Payer: BLUE CROSS/BLUE SHIELD

## 2016-01-20 ENCOUNTER — Observation Stay (HOSPITAL_COMMUNITY)
Admission: RE | Admit: 2016-01-20 | Discharge: 2016-01-21 | Disposition: A | Discharge status: Home / Self Care | Payer: BLUE CROSS/BLUE SHIELD | Source: Ambulatory Visit | Attending: Orthopedic Surgery | Admitting: Orthopedic Surgery

## 2016-01-20 DIAGNOSIS — K219 Gastro-esophageal reflux disease without esophagitis: Secondary | ICD-10-CM | POA: Insufficient documentation

## 2016-01-20 DIAGNOSIS — M24522 Contracture, left elbow: Secondary | ICD-10-CM | POA: Diagnosis present

## 2016-01-20 DIAGNOSIS — R05 Cough: Secondary | ICD-10-CM

## 2016-01-20 DIAGNOSIS — I10 Essential (primary) hypertension: Secondary | ICD-10-CM | POA: Insufficient documentation

## 2016-01-20 DIAGNOSIS — Z8673 Personal history of transient ischemic attack (TIA), and cerebral infarction without residual deficits: Secondary | ICD-10-CM | POA: Diagnosis not present

## 2016-01-20 DIAGNOSIS — M898X9 Other specified disorders of bone, unspecified site: Secondary | ICD-10-CM | POA: Diagnosis present

## 2016-01-20 DIAGNOSIS — Z419 Encounter for procedure for purposes other than remedying health state, unspecified: Secondary | ICD-10-CM

## 2016-01-20 DIAGNOSIS — R531 Weakness: Secondary | ICD-10-CM | POA: Insufficient documentation

## 2016-01-20 DIAGNOSIS — G4730 Sleep apnea, unspecified: Secondary | ICD-10-CM | POA: Diagnosis not present

## 2016-01-20 DIAGNOSIS — R0600 Dyspnea, unspecified: Secondary | ICD-10-CM

## 2016-01-20 DIAGNOSIS — R059 Cough, unspecified: Secondary | ICD-10-CM

## 2016-01-20 DIAGNOSIS — Q740 Other congenital malformations of upper limb(s), including shoulder girdle: Secondary | ICD-10-CM | POA: Diagnosis not present

## 2016-01-20 HISTORY — PX: BONE EXCISION: SHX6730

## 2016-01-20 SURGERY — BONE EXCISION
Anesthesia: General | Site: Elbow | Laterality: Left

## 2016-01-20 MED ORDER — PHENYLEPHRINE HCL 10 MG/ML IJ SOLN
INTRAMUSCULAR | Status: DC | PRN
  Administered 2016-01-20 (×2): 40 ug via INTRAVENOUS
  Administered 2016-01-20 (×2): 80 ug via INTRAVENOUS

## 2016-01-20 MED ORDER — FENTANYL CITRATE (PF) 100 MCG/2ML IJ SOLN
INTRAMUSCULAR | Status: AC
  Filled 2016-01-20: qty 2

## 2016-01-20 MED ORDER — OXYCODONE-ACETAMINOPHEN 5-325 MG PO TABS
1.0000 | ORAL_TABLET | Freq: Four times a day (QID) | ORAL | 0 refills | Status: DC | PRN
Start: 2016-01-20 — End: 2016-11-16

## 2016-01-20 MED ORDER — ACETAMINOPHEN 325 MG PO TABS: 325.0000 mg | ORAL_TABLET | ORAL | Status: DC | PRN

## 2016-01-20 MED ORDER — IPRATROPIUM-ALBUTEROL 0.5-2.5 (3) MG/3ML IN SOLN: 3.0000 mL | Freq: Once | RESPIRATORY_TRACT | Status: DC

## 2016-01-20 MED ORDER — FENTANYL CITRATE (PF) 100 MCG/2ML IJ SOLN
INTRAMUSCULAR | Status: DC | PRN
  Administered 2016-01-20: 100 ug via INTRAVENOUS
  Administered 2016-01-20 (×2): 50 ug via INTRAVENOUS

## 2016-01-20 MED ORDER — LIDOCAINE HCL (CARDIAC) 20 MG/ML IV SOLN
INTRAVENOUS | Status: DC | PRN
  Administered 2016-01-20: 40 mg via INTRAVENOUS

## 2016-01-20 MED ORDER — OXYCODONE HCL 5 MG PO TABS: 5.0000 mg | ORAL_TABLET | Freq: Once | ORAL | Status: DC | PRN

## 2016-01-20 MED ORDER — 0.9 % SODIUM CHLORIDE (POUR BTL) OPTIME
TOPICAL | Status: DC | PRN
  Administered 2016-01-20: 1000 mL

## 2016-01-20 MED ORDER — POTASSIUM CHLORIDE IN NACL 20-0.9 MEQ/L-% IV SOLN
INTRAVENOUS | Status: DC
  Administered 2016-01-20: 18:00:00 via INTRAVENOUS
  Filled 2016-01-20: qty 1000

## 2016-01-20 MED ORDER — ACETAMINOPHEN 160 MG/5ML PO SOLN: 325.0000 mg | ORAL | Status: DC | PRN

## 2016-01-20 MED ORDER — FLUOXETINE HCL 20 MG PO CAPS
20.0000 mg | ORAL_CAPSULE | Freq: Every day | ORAL | Status: DC
  Administered 2016-01-21: 20 mg via ORAL
  Filled 2016-01-20: qty 1

## 2016-01-20 MED ORDER — ROCURONIUM BROMIDE 10 MG/ML (PF) SYRINGE
PREFILLED_SYRINGE | INTRAVENOUS | Status: AC
  Filled 2016-01-20: qty 10

## 2016-01-20 MED ORDER — ATORVASTATIN CALCIUM 80 MG PO TABS
80.0000 mg | ORAL_TABLET | Freq: Every day | ORAL | Status: DC
  Filled 2016-01-20: qty 1

## 2016-01-20 MED ORDER — METOCLOPRAMIDE HCL 5 MG/ML IJ SOLN
5.0000 mg | Freq: Three times a day (TID) | INTRAMUSCULAR | Status: DC | PRN
  Administered 2016-01-20 – 2016-01-21 (×2): 10 mg via INTRAVENOUS
  Filled 2016-01-20 (×2): qty 2

## 2016-01-20 MED ORDER — KETOROLAC TROMETHAMINE 15 MG/ML IJ SOLN
15.0000 mg | Freq: Four times a day (QID) | INTRAMUSCULAR | Status: DC
  Administered 2016-01-20 – 2016-01-21 (×3): 15 mg via INTRAVENOUS
  Filled 2016-01-20 (×3): qty 1

## 2016-01-20 MED ORDER — ONDANSETRON HCL 4 MG PO TABS: 4.0000 mg | ORAL_TABLET | Freq: Four times a day (QID) | ORAL | Status: DC | PRN

## 2016-01-20 MED ORDER — MIDAZOLAM HCL 2 MG/2ML IJ SOLN
INTRAMUSCULAR | Status: AC
  Filled 2016-01-20: qty 2

## 2016-01-20 MED ORDER — OXYCODONE HCL 5 MG/5ML PO SOLN: 5.0000 mg | Freq: Once | ORAL | Status: DC | PRN

## 2016-01-20 MED ORDER — LORATADINE 10 MG PO TABS
10.0000 mg | ORAL_TABLET | Freq: Every day | ORAL | Status: DC
  Administered 2016-01-21: 10 mg via ORAL
  Filled 2016-01-20: qty 1

## 2016-01-20 MED ORDER — PROPOFOL 10 MG/ML IV BOLUS
INTRAVENOUS | Status: AC
  Filled 2016-01-20: qty 40

## 2016-01-20 MED ORDER — METHOCARBAMOL 1000 MG/10ML IJ SOLN
500.0000 mg | Freq: Four times a day (QID) | INTRAVENOUS | Status: DC | PRN
  Filled 2016-01-20: qty 5

## 2016-01-20 MED ORDER — SUGAMMADEX SODIUM 200 MG/2ML IV SOLN
INTRAVENOUS | Status: DC | PRN
  Administered 2016-01-20: 260 mg via INTRAVENOUS

## 2016-01-20 MED ORDER — PANTOPRAZOLE SODIUM 20 MG PO TBEC
20.0000 mg | DELAYED_RELEASE_TABLET | Freq: Every day | ORAL | Status: DC | PRN
  Filled 2016-01-20: qty 1

## 2016-01-20 MED ORDER — METHOCARBAMOL 500 MG PO TABS
500.0000 mg | ORAL_TABLET | Freq: Four times a day (QID) | ORAL | 0 refills | Status: DC | PRN
Start: 2016-01-20 — End: 2016-11-16

## 2016-01-20 MED ORDER — PROPOFOL 10 MG/ML IV BOLUS
INTRAVENOUS | Status: DC | PRN
  Administered 2016-01-20: 200 mg via INTRAVENOUS

## 2016-01-20 MED ORDER — PHENYLEPHRINE 40 MCG/ML (10ML) SYRINGE FOR IV PUSH (FOR BLOOD PRESSURE SUPPORT)
PREFILLED_SYRINGE | INTRAVENOUS | Status: AC
  Filled 2016-01-20: qty 10

## 2016-01-20 MED ORDER — BUPIVACAINE HCL (PF) 0.25 % IJ SOLN
INTRAMUSCULAR | Status: AC
  Filled 2016-01-20: qty 30

## 2016-01-20 MED ORDER — SUCCINYLCHOLINE CHLORIDE 20 MG/ML IJ SOLN
INTRAMUSCULAR | Status: DC | PRN
  Administered 2016-01-20: 60 mg via INTRAVENOUS

## 2016-01-20 MED ORDER — ACETAMINOPHEN 650 MG RE SUPP: 650.0000 mg | Freq: Four times a day (QID) | RECTAL | Status: DC | PRN

## 2016-01-20 MED ORDER — ONDANSETRON HCL 4 MG/2ML IJ SOLN
INTRAMUSCULAR | Status: AC
  Filled 2016-01-20: qty 2

## 2016-01-20 MED ORDER — KETOROLAC TROMETHAMINE 10 MG PO TABS: 10.0000 mg | ORAL_TABLET | Freq: Four times a day (QID) | ORAL | 0 refills | Status: DC | PRN

## 2016-01-20 MED ORDER — OXYCODONE HCL 5 MG PO TABS
5.0000 mg | ORAL_TABLET | ORAL | Status: DC | PRN
  Administered 2016-01-20 – 2016-01-21 (×3): 10 mg via ORAL
  Filled 2016-01-20 (×3): qty 2

## 2016-01-20 MED ORDER — EPHEDRINE SULFATE 50 MG/ML IJ SOLN
INTRAMUSCULAR | Status: DC | PRN
  Administered 2016-01-20: 10 mg via INTRAVENOUS

## 2016-01-20 MED ORDER — METOCLOPRAMIDE HCL 5 MG PO TABS: 5.0000 mg | ORAL_TABLET | Freq: Three times a day (TID) | ORAL | Status: DC | PRN

## 2016-01-20 MED ORDER — SUCCINYLCHOLINE CHLORIDE 200 MG/10ML IV SOSY
PREFILLED_SYRINGE | INTRAVENOUS | Status: AC
  Filled 2016-01-20: qty 10

## 2016-01-20 MED ORDER — ACETAMINOPHEN 325 MG PO TABS: 650.0000 mg | ORAL_TABLET | Freq: Four times a day (QID) | ORAL | Status: DC | PRN

## 2016-01-20 MED ORDER — SUGAMMADEX SODIUM 200 MG/2ML IV SOLN
INTRAVENOUS | Status: AC
  Filled 2016-01-20: qty 4

## 2016-01-20 MED ORDER — FENTANYL CITRATE (PF) 100 MCG/2ML IJ SOLN: 25.0000 ug | INTRAMUSCULAR | Status: DC | PRN

## 2016-01-20 MED ORDER — LACTATED RINGERS IV SOLN
INTRAVENOUS | Status: DC
  Administered 2016-01-20 (×5): via INTRAVENOUS

## 2016-01-20 MED ORDER — ROCURONIUM BROMIDE 100 MG/10ML IV SOLN
INTRAVENOUS | Status: DC | PRN
  Administered 2016-01-20: 10 mg via INTRAVENOUS
  Administered 2016-01-20: 20 mg via INTRAVENOUS
  Administered 2016-01-20: 40 mg via INTRAVENOUS
  Administered 2016-01-20: 20 mg via INTRAVENOUS

## 2016-01-20 MED ORDER — HYDROMORPHONE HCL 1 MG/ML IJ SOLN: 0.5000 mg | INTRAMUSCULAR | Status: DC | PRN

## 2016-01-20 MED ORDER — CEFAZOLIN IN D5W 1 GM/50ML IV SOLN
1.0000 g | Freq: Four times a day (QID) | INTRAVENOUS | Status: AC
  Administered 2016-01-20 – 2016-01-21 (×3): 1 g via INTRAVENOUS
  Filled 2016-01-20 (×3): qty 50

## 2016-01-20 MED ORDER — OXYCODONE HCL 5 MG PO TABS: 5.0000 mg | ORAL_TABLET | Freq: Four times a day (QID) | ORAL | 0 refills | Status: DC | PRN

## 2016-01-20 MED ORDER — METHOCARBAMOL 500 MG PO TABS: 500.0000 mg | ORAL_TABLET | Freq: Four times a day (QID) | ORAL | Status: DC | PRN

## 2016-01-20 MED ORDER — PHENYLEPHRINE HCL 10 MG/ML IJ SOLN
INTRAMUSCULAR | Status: DC | PRN
  Administered 2016-01-20: 40 ug/min via INTRAVENOUS

## 2016-01-20 MED ORDER — CHLORHEXIDINE GLUCONATE 4 % EX LIQD: 60.0000 mL | Freq: Once | CUTANEOUS | Status: DC

## 2016-01-20 MED ORDER — ONDANSETRON HCL 4 MG/2ML IJ SOLN
4.0000 mg | Freq: Four times a day (QID) | INTRAMUSCULAR | Status: DC | PRN
  Administered 2016-01-20 (×2): 4 mg via INTRAVENOUS
  Filled 2016-01-20: qty 2

## 2016-01-20 MED ORDER — BUPROPION HCL ER (XL) 150 MG PO TB24
300.0000 mg | ORAL_TABLET | Freq: Every day | ORAL | Status: DC
  Administered 2016-01-21: 300 mg via ORAL
  Filled 2016-01-20: qty 2

## 2016-01-20 MED ORDER — EPHEDRINE SULFATE 50 MG/ML IJ SOLN
INTRAMUSCULAR | Status: AC
  Filled 2016-01-20: qty 1

## 2016-01-20 MED ORDER — OMEPRAZOLE MAGNESIUM 20 MG PO TBEC: 20.0000 mg | DELAYED_RELEASE_TABLET | Freq: Every day | ORAL | Status: DC

## 2016-01-20 MED ORDER — ONDANSETRON HCL 4 MG/2ML IJ SOLN
INTRAMUSCULAR | Status: DC | PRN
Start: 2016-01-20 — End: 2016-01-20
  Administered 2016-01-20: 4 mg via INTRAVENOUS

## 2016-01-20 MED ORDER — BUPIVACAINE-EPINEPHRINE (PF) 0.5% -1:200000 IJ SOLN
INTRAMUSCULAR | Status: DC | PRN
  Administered 2016-01-20: 30 mL via PERINEURAL

## 2016-01-20 MED ORDER — MIDAZOLAM HCL 5 MG/5ML IJ SOLN
INTRAMUSCULAR | Status: DC | PRN
  Administered 2016-01-20 (×2): 1 mg via INTRAVENOUS

## 2016-01-20 MED ORDER — LIDOCAINE 2% (20 MG/ML) 5 ML SYRINGE
INTRAMUSCULAR | Status: AC
  Filled 2016-01-20: qty 5

## 2016-01-20 SURGICAL SUPPLY — 70 items
BANDAGE ACE 3X5.8 VEL STRL LF (GAUZE/BANDAGES/DRESSINGS) ×2 IMPLANT
BANDAGE ACE 4X5 VEL STRL LF (GAUZE/BANDAGES/DRESSINGS) ×2 IMPLANT
BANDAGE ELASTIC 4 VELCRO ST LF (GAUZE/BANDAGES/DRESSINGS) IMPLANT
BANDAGE ELASTIC 6 VELCRO ST LF (GAUZE/BANDAGES/DRESSINGS) IMPLANT
BANDAGE ESMARK 6X9 LF (GAUZE/BANDAGES/DRESSINGS) ×1 IMPLANT
BLADE SURG 10 STRL SS (BLADE) ×3 IMPLANT
BNDG CMPR 9X6 STRL LF SNTH (GAUZE/BANDAGES/DRESSINGS) ×1
BNDG COHESIVE 4X5 TAN STRL (GAUZE/BANDAGES/DRESSINGS) ×3 IMPLANT
BNDG ESMARK 6X9 LF (GAUZE/BANDAGES/DRESSINGS) ×3
BNDG GAUZE ELAST 4 BULKY (GAUZE/BANDAGES/DRESSINGS) ×8 IMPLANT
BRUSH SCRUB DISP (MISCELLANEOUS) ×6 IMPLANT
COVER MAYO STAND STRL (DRAPES) ×3 IMPLANT
COVER SURGICAL LIGHT HANDLE (MISCELLANEOUS) ×6 IMPLANT
DRAPE C-ARM 42X72 X-RAY (DRAPES) IMPLANT
DRAPE C-ARMOR (DRAPES) ×3 IMPLANT
DRAPE OEC MINIVIEW 54X84 (DRAPES) ×3 IMPLANT
DRAPE U-SHAPE 47X51 STRL (DRAPES) ×3 IMPLANT
DRSG ADAPTIC 3X8 NADH LF (GAUZE/BANDAGES/DRESSINGS) ×2 IMPLANT
DRSG EMULSION OIL 3X3 NADH (GAUZE/BANDAGES/DRESSINGS) IMPLANT
DRSG PAD ABDOMINAL 8X10 ST (GAUZE/BANDAGES/DRESSINGS) ×4 IMPLANT
ELECT REM PT RETURN 9FT ADLT (ELECTROSURGICAL) ×3
ELECTRODE REM PT RTRN 9FT ADLT (ELECTROSURGICAL) ×1 IMPLANT
GAUZE SPONGE 4X4 12PLY STRL (GAUZE/BANDAGES/DRESSINGS) IMPLANT
GAUZE XEROFORM 1X8 LF (GAUZE/BANDAGES/DRESSINGS) IMPLANT
GLOVE BIO SURGEON STRL SZ7.5 (GLOVE) ×5 IMPLANT
GLOVE BIO SURGEON STRL SZ8 (GLOVE) ×5 IMPLANT
GLOVE BIOGEL PI IND STRL 7.5 (GLOVE) ×1 IMPLANT
GLOVE BIOGEL PI IND STRL 8 (GLOVE) ×1 IMPLANT
GLOVE BIOGEL PI INDICATOR 7.5 (GLOVE) ×2
GLOVE BIOGEL PI INDICATOR 8 (GLOVE) ×2
GOWN STRL REUS W/ TWL LRG LVL3 (GOWN DISPOSABLE) ×2 IMPLANT
GOWN STRL REUS W/ TWL XL LVL3 (GOWN DISPOSABLE) ×1 IMPLANT
GOWN STRL REUS W/TWL LRG LVL3 (GOWN DISPOSABLE) ×6
GOWN STRL REUS W/TWL XL LVL3 (GOWN DISPOSABLE) ×3
KIT BASIN OR (CUSTOM PROCEDURE TRAY) ×3 IMPLANT
KIT ROOM TURNOVER OR (KITS) ×3 IMPLANT
MANIFOLD NEPTUNE II (INSTRUMENTS) ×3 IMPLANT
NDL HYPO 21X1.5 SAFETY (NEEDLE) IMPLANT
NEEDLE HYPO 21X1.5 SAFETY (NEEDLE) IMPLANT
NS IRRIG 1000ML POUR BTL (IV SOLUTION) ×3 IMPLANT
PACK ORTHO EXTREMITY (CUSTOM PROCEDURE TRAY) ×3 IMPLANT
PAD ARMBOARD 7.5X6 YLW CONV (MISCELLANEOUS) ×6 IMPLANT
PAD CAST 4YDX4 CTTN HI CHSV (CAST SUPPLIES) IMPLANT
PADDING CAST COTTON 4X4 STRL (CAST SUPPLIES)
PADDING CAST COTTON 6X4 STRL (CAST SUPPLIES) IMPLANT
PENCIL BUTTON HOLSTER BLD 10FT (ELECTRODE) ×3 IMPLANT
SPONGE GAUZE 4X4 12PLY STER LF (GAUZE/BANDAGES/DRESSINGS) ×2 IMPLANT
SPONGE LAP 18X18 X RAY DECT (DISPOSABLE) ×9 IMPLANT
SPONGE SCRUB IODOPHOR (GAUZE/BANDAGES/DRESSINGS) ×3 IMPLANT
STAPLER VISISTAT 35W (STAPLE) ×3 IMPLANT
STOCKINETTE IMPERVIOUS 9X36 MD (GAUZE/BANDAGES/DRESSINGS) ×2 IMPLANT
SUCTION FRAZIER HANDLE 10FR (MISCELLANEOUS) ×2
SUCTION TUBE FRAZIER 10FR DISP (MISCELLANEOUS) ×1 IMPLANT
SUT ETHILON 3 0 PS 1 (SUTURE) ×6 IMPLANT
SUT FIBERWIRE #2 38 REV NDL BL (SUTURE) ×3
SUT VIC AB 0 CT1 27 (SUTURE) ×3
SUT VIC AB 0 CT1 27XBRD ANBCTR (SUTURE) IMPLANT
SUT VIC AB 1 CT1 27 (SUTURE) ×3
SUT VIC AB 1 CT1 27XBRD ANBCTR (SUTURE) IMPLANT
SUT VIC AB 2-0 CT1 27 (SUTURE) ×3
SUT VIC AB 2-0 CT1 TAPERPNT 27 (SUTURE) ×2 IMPLANT
SUTURE FIBERWR#2 38 REV NDL BL (SUTURE) IMPLANT
SYR CONTROL 10ML LL (SYRINGE) IMPLANT
TOWEL OR 17X24 6PK STRL BLUE (TOWEL DISPOSABLE) ×3 IMPLANT
TOWEL OR 17X26 10 PK STRL BLUE (TOWEL DISPOSABLE) ×6 IMPLANT
TUBE CONNECTING 12'X1/4 (SUCTIONS) ×1
TUBE CONNECTING 12X1/4 (SUCTIONS) ×2 IMPLANT
UNDERPAD 30X30 INCONTINENT (UNDERPADS AND DIAPERS) ×3 IMPLANT
WATER STERILE IRR 1000ML POUR (IV SOLUTION) ×3 IMPLANT
YANKAUER SUCT BULB TIP NO VENT (SUCTIONS) ×2 IMPLANT

## 2016-01-20 NOTE — Anesthesia Procedure Notes (Signed)
Procedure Name: Intubation Date/Time: 01/20/2016 9:02 AM Performed by: Darcey NoraJAMES, Lassie Demorest B Pre-anesthesia Checklist: Patient identified, Emergency Drugs available, Suction available and Patient being monitored Patient Re-evaluated:Patient Re-evaluated prior to inductionOxygen Delivery Method: Circle system utilized Preoxygenation: Pre-oxygenation with 100% oxygen Intubation Type: IV induction Ventilation: Mask ventilation without difficulty Laryngoscope Size: Glidescope and 4 Grade View: Grade II Tube type: Oral Tube size: 7.5 mm Number of attempts: 1 Airway Equipment and Method: Video-laryngoscopy and Stylet Placement Confirmation: ETT inserted through vocal cords under direct vision,  positive ETCO2 and breath sounds checked- equal and bilateral Secured at: 23 (cm at teeth) cm Tube secured with: Tape Dental Injury: Teeth and Oropharynx as per pre-operative assessment

## 2016-01-20 NOTE — Transfer of Care (Signed)
Immediate Anesthesia Transfer of Care Note  Patient: Phillip ClimesDavid W Amore  Procedure(s) Performed: Procedure(s): Radical EXCISION HETEROTOPIC OSSIFICATION LEFT ELBOW (Left)  Patient Location: PACU  Anesthesia Type:General plus regional block for post op pain control  Level of Consciousness: awake and patient cooperative  Airway & Oxygen Therapy: Patient Spontanous Breathing and Patient connected to nasal cannula oxygen  Post-op Assessment: Report given to RN, Post -op Vital signs reviewed and stable and Patient moving all extremities  Post vital signs: Reviewed and stable  Last Vitals:  Vitals:   01/20/16 0809  BP: (!) 162/98  Pulse: 78  Resp: 20  Temp: 36.6 C    Last Pain:  Vitals:   01/20/16 0809  TempSrc: Oral      Patients Stated Pain Goal: 4 (01/20/16 0759)  Complications: No apparent anesthesia complications

## 2016-01-20 NOTE — Anesthesia Procedure Notes (Signed)
Anesthesia Regional Block:  Supraclavicular block  Pre-Anesthetic Checklist: ,, timeout performed, Correct Patient, Correct Site, Correct Laterality, Correct Procedure, Correct Position, site marked, Risks and benefits discussed,  Surgical consent,  Pre-op evaluation,  At surgeon's request and post-op pain management  Laterality: Upper and Left  Prep: chloraprep       Needles:  Injection technique: Single-shot  Needle Type: Echogenic Needle          Additional Needles:  Procedures: ultrasound guided (picture in chart) Supraclavicular block Narrative:  Injection made incrementally with aspirations every 5 mL.  Performed by: Personally   Additional Notes: H+P and labs reviewed, risks and benefits discussed with patient, procedure tolerated well without complications      

## 2016-01-20 NOTE — Anesthesia Procedure Notes (Signed)
Anesthesia Regional Block: Narrative:       

## 2016-01-20 NOTE — Anesthesia Preprocedure Evaluation (Addendum)
Anesthesia Evaluation  Patient identified by MRN, date of birth, ID band Patient awake    Reviewed: Allergy & Precautions, NPO status , Patient's Chart, lab work & pertinent test results, reviewed documented beta blocker date and time   History of Anesthesia Complications (+) PONVNegative for: history of anesthetic complications  Airway Mallampati: I  TM Distance: >3 FB Neck ROM: Full    Dental  (+) Teeth Intact, Dental Advisory Given   Pulmonary neg pulmonary ROS, sleep apnea ,  Probable OSA, not yet had his sleep study   breath sounds clear to auscultation       Cardiovascular hypertension, Pt. on medications  Rhythm:Regular Rate:Normal     Neuro/Psych negative neurological ROS     GI/Hepatic Neg liver ROS, GERD  Medicated and Controlled,  Endo/Other  Morbid obesity  Renal/GU negative Renal ROS     Musculoskeletal   Abdominal (+) + obese,   Peds  Hematology negative hematology ROS (+)   Anesthesia Other Findings Full Beard, Huge Neck  Reproductive/Obstetrics                            Anesthesia Physical Anesthesia Plan  ASA: II  Anesthesia Plan: General   Post-op Pain Management:  Regional for Post-op pain   Induction: Intravenous  Airway Management Planned: LMA and Oral ETT  Additional Equipment: None  Intra-op Plan:   Post-operative Plan: Extubation in OR  Informed Consent: I have reviewed the patients History and Physical, chart, labs and discussed the procedure including the risks, benefits and alternatives for the proposed anesthesia with the patient or authorized representative who has indicated his/her understanding and acceptance.   Dental advisory given  Plan Discussed with: CRNA and Surgeon  Anesthesia Plan Comments:         Anesthesia Quick Evaluation

## 2016-01-20 NOTE — H&P (Signed)
Orthopaedic Trauma Service H&P  Chief Complaint: L elbow heterotopic ossification  HPI:   55 y/o male s/p ORIF subacute L elbow fracture dislocation, pt has developed severe HO of L elbow with significant impact on ADL's. Pt has poor function of elbow and forearm. Pt presents today for excision of HO   Past Medical History:  Diagnosis Date  . Allergy    rhinitis  . Anxiety   . Depression   . Dislocation of left elbow 06/27/2015  . GERD (gastroesophageal reflux disease)   . History of kidney stones   . Hypertension   . PONV (postoperative nausea and vomiting)    Nausea  . Stroke (HCC) 06/2015  . TIA (transient ischemic attack)     Past Surgical History:  Procedure Laterality Date  . LAPAROSCOPIC CHOLECYSTECTOMY  07   Tsuri  . ORIF ELBOW FRACTURE Left 06/25/2015   Procedure: OPEN REDUCTION INTERNAL FIXATION (ORIF) ELBOW/OLECRANON FRACTURE RADIO HEAD ARTHROPLASTY;  Surgeon: Myrene GalasMichael Handy, MD;  [facility];  Service: Orthopedics;  Laterality: Left;  . TEE WITHOUT CARDIOVERSION N/A 11/29/2015   Procedure: TRANSESOPHAGEAL ECHOCARDIOGRAM (TEE);  Surgeon: Jake BatheMark C Skains, MD;  [facility];  Service: Cardiovascular;  Laterality: N/A;    Family History  Problem Relation Age of Onset  . Migraines Mother   . Dementia Mother   . Anemia Sister    Social History:  reports that he has never smoked. He has never used smokeless tobacco. He reports that he drinks alcohol. He reports that he does not use drugs.  Allergies: No Known Allergies  No prescriptions prior to admission.    No results found for this or any previous visit (from the past 48 hour(s)). No results found.  Review of Systems  Constitutional: Negative for chills and fever.  Respiratory: Negative for shortness of breath.   Cardiovascular: Negative for chest pain and palpitations.  Gastrointestinal: Negative for abdominal pain and vomiting.  Genitourinary: Negative for dysuria.  Musculoskeletal:   Restricted L elbow motion   Neurological: Negative for tingling.   Vitals on arrival to short stay  Physical Exam  Constitutional: He appears well-developed and well-nourished. No distress.  Cardiovascular: Normal rate and regular rhythm.   Respiratory: Effort normal and breath sounds normal. No respiratory distress.  GI: Soft. Bowel sounds are normal.  Musculoskeletal:  Left elbow    Incision healed   Ext warm    + radial pulse   R/U/M/AIN/PIN motor intact   R/U/M sensation intact     ROM L elbow 40-100 degrees    Severely restricted supination and pronation     Assessment/Plan  55 y/o male with L elbow HO  OR for excision of HO and capsulectomy L elbow High risk procedure as HO very close NV structures Pt understands risks of surgery and wishes to proceed   Mearl LatinUL,Subrena Devereux W, PA-C 01/20/2016, 6:28 AM

## 2016-01-20 NOTE — Op Note (Deleted)
  The note originally documented on this encounter has been moved the the encounter in which it belongs.  

## 2016-01-20 NOTE — Op Note (Signed)
NAME:  Phillip Ortega, Phillip Ortega                  ACCOUNT NO.:  [id number]  MEDICAL RECORD NO.:  [id number]  LOCATION:                               FACILITY:  [facility]  PHYSICIAN:  Doralee AlbinoMichael H. Carola FrostHandy, M.D. DATE OF BIRTH:  05-07-1961  DATE OF PROCEDURE: DATE OF DISCHARGE:                              OPERATIVE REPORT   PREOPERATIVE DIAGNOSES: 1. Left elbow severe heterotopic ossification. 2. Left elbow contracture. 3. Synostosis, proximal radius and ulna.  POSTOPERATIVE DIAGNOSES: 1. Left elbow severe heterotopic ossification. 2. Left elbow contracture. 3. Synostosis, proximal radius and ulna.  PROCEDURES: 1. Radical excision heterotopic ossification, left elbow with     capsulectomy. 2. Removal of proximal synostosis, radius and ulna. 3. Manipulation of the elbow.  SURGEON:  Doralee AlbinoMichael H. Carola FrostHandy, M.D.  ASSISTANT:  Montez MoritaKeith Paul, PA-C.  ANESTHESIA:  General and regional block.  I/O:  2000 mL IV fluids, EBL 50 mL.  DRAINS:  None.  SPECIMENS:  None.  DISPOSITION:  To PACU.  CONDITION:  Stable.  BRIEF SUMMARY AND INDICATION FOR PROCEDURE:  Phillip Ortega is a 55 year old, right-hand-dominant male, who sustained a fracture dislocation removed from his hospital treated with initial reduction.  He underwent subsequent dislocation and continued with the elbow dislocated for some time prior to surgical repair.  The patient underwent successful surgical repair of his fracture dislocation, but developed rather severe heterotopic ossification postoperatively.  He has been followed, waiting for this to mature and now presents for elective removal.  During that time, he has lost elbow flexion extension such that his range is 40 degrees to 95 degrees.  Furthermore, he also developed a distinct synostosis between the radius and ulna distal to the elbow joint and that resulted in complete ankylosis and loss of any rotation being a fixed in neutral position.  I discussed with him the risks and benefits of  surgery including the high risk of injury to the posterior interosseous nerve.  They could reduce finger thumb and wrist extension, other nerve or vessel injury, possibility of recurrence of heterotopic bone or recurrent loss of motion, and need for further surgeries among others.  The patient acknowledged these risks and did wish to proceed.  BRIEF SUMMARY OF PROCEDURE:  Phillip Ortega was given preoperative antibiotics, taken to the operating room where general anesthesia was induced.  His left upper extremity was prepped and draped in the usual sterile fashion.  He did receive a regional block preoperatively that did result in loss of neurologic motor function and sensory in addition to the pain relief.  After standard sterile prep and drape, the arm was elevated and exsanguinated with an Esmarch bandage.  Tourniquet inflated to 300 mmHg.  AO Kocher-Langenbeck incision was remade and extended proximally and distally to allow for improved access to the joint.  I continued down to the old FiberWire, which were exposed and removed without complication.  I continued with lateral access to the joint maintaining all the posterior attachments along the lateral epicondylar ridge.  I then very carefully exposed the capsule both on the deep posterior side adjacent to the articular surface as well as on the anterior aspect.  I was very careful  to perform sharp directed elevation with a 15 blade so as to avoid Bovie irritation the posterior interosseous nerve over the anterolateral proximal radius.  I was able to go along the coronoid and expose prominent heterotopic bone there and then performed the capsulectomy.  This did require an assistant to hold the anterior neurovascular structures free and then osteotome was used for direct debridement of this.  I removed the anterior heterotopic bone off the proximal ulna along the elbow joint and also the radius along the elbow joint.  Once this was removed,  I was able to gain flexion, significantly improved flexion of the elbow but on the lateral there was still a large sessile mass of heterotopic bone along the ulna preventing further flexion.  Consequently once more I used osteotome and my assistant for deep retraction to visualize across the anterior surface of the ulna and performed another osteotomy of the heterotopic bone.  I then manipulated the elbow.  The patient's scores remained under anesthesia and through gentle steady pressure was able to gain large increase and flexion going from 95 to 135, and also to gain full extension to 0.  The wound was irrigated thoroughly.  The forearm remained locked in neutral at that point, and I then began to explore the proximal synostosis that had developed between the radius and ulna.  This required further dissection down into the forearm on the lateral side along the radial neck. Which again was drawn under direct meticulous visualization and sharply rather than with the Bovie to avoid any irritation or possible injury to the posterior interosseous nerve. The heterotopic bone curved from the ulna up over the entire neck of the radius and this extended down the proximal shaft some distance.  I used a curved osteotome to remove it and was still unable to generate any rotation.  I then very carefully teased back the soft tissues again under direct visualization with concern for the post interosseous nerve anteriorly and use the osteotome under direct visualization again curve to excise this portion of synostosis between the radius and ulna and then again under direct visualization the 15 blade was used to remove some scar there as well.  The heterotopic bone had grown from the radius in such a way as to result in loss of space of sphericity or the natural curvature of the bone and I had used a series of osteotomes and Rogers along with the help of my assistant.  He began to rotate the  forearm from neutral to full pronation and then back to full supination. Ultimately, this resulted in restoration of these degrees of freedom, but was again exceedingly difficult technically and required very careful retraction and an Geophysicist/field seismologistassistant.  Once this was complete, the wound was irrigated thoroughly.  The tourniquet was deflated.  We did not encounter any significant bleeding vessels and rather oozing from the capsular areas and this was watched for several minutes before being packed.  At that point, we brought the C-arm back in and checked several laterals in both extension and flexion as well as multiple APs and other rotational views to gauge the adequate resection of bone.  This resulted in the tourniquet being down for 15-20 minutes total and then the arm was elevated once more exsanguinated with the Esmarch bandage and inflated for another short period of time for closure.  This was achieved by using #2 FiberWire to repair the lateral ulnar collateral ligament, #1 Vicryl for the deep layer, 2-0 Vicryl, and 3-0  nylon for the skin.  A sterile bulky gently compressive dressing was applied in the sling.  The patient was awakened from anesthesia and transferred to PACU in stable condition.  Montez Morita, PA-C assisted me during the procedure.  PROGNOSIS:  The patient will be allowed and encouraged intermediate forearm rotation and elbow flexion and extension.  This will be primarily from 60 to 135 degrees of flexion and then we will focus on forearm rotation in 90 degrees involving supination as well as pronation, but in extension primarily focus on pronation for the next 8 weeks into the lateral ulnar collateral ligament _____ had enough time to heal and allow for combined extension and supination.  The patient will be examined postoperatively and on his return for his neurovascular function and in particular were very concerned about his posterior interosseous nerve from  retraction which was certainly a risk despite the constant attention given to it.  He has also at risk for recurrence of heterotopic bone or recurrent loss of motion, but we are hopeful that with aggressive therapy this can be avoided.     Doralee Albino. Carola Frost, M.D.   ______________________________ Doralee Albino. Carola Frost, M.D.    MHH/MEDQ  D:  01/20/2016  T:  01/20/2016  Job:  [id number]

## 2016-01-20 NOTE — Discharge Instructions (Signed)
Orthopaedic Trauma Service Discharge Instructions   General Discharge Instructions  WEIGHT BEARING STATUS: as tolerated left upper extremity   RANGE OF MOTION/ACTIVITY: as tolerated left elbow  Wound Care: daily dressing changes starting on 01/22/2016 Discharge Wound Care Instructions  Do NOT apply any ointments, solutions or lotions to pin sites or surgical wounds.  These prevent needed drainage and even though solutions like hydrogen peroxide kill bacteria, they also damage cells lining the pin sites that help fight infection.  Applying lotions or ointments can keep the wounds moist and can cause them to breakdown and open up as well. This can increase the risk for infection. When in doubt call the office.  Surgical incisions should be dressed daily.  If any drainage is noted, use one layer of adaptic, then gauze, Kerlix, and an ace wrap.  Once the incision is completely dry and without drainage, it may be left open to air out.  Showering may begin 36-48 hours later.  Cleaning gently with soap and water.  Traumatic wounds should be dressed daily as well.    One layer of adaptic, gauze, Kerlix, then ace wrap.  The adaptic can be discontinued once the draining has ceased    If you have a wet to dry dressing: wet the gauze with saline the squeeze as much saline out so the gauze is moist (not soaking wet), place moistened gauze over wound, then place a dry gauze over the moist one, followed by Kerlix wrap, then ace wrap.  PAIN MEDICATION USE AND EXPECTATIONS  You have likely been given narcotic medications to help control your pain.  After a traumatic event that results in an fracture (broken bone) with or without surgery, it is ok to use narcotic pain medications to help control one's pain.  We understand that everyone responds to pain differently and each individual patient will be evaluated on a regular basis for the continued need for narcotic medications. Ideally, narcotic medication use  should last no more than 6-8 weeks (coinciding with fracture healing).   As a patient it is your responsibility as well to monitor narcotic medication use and report the amount and frequency you use these medications when you come to your office visit.   We would also advise that if you are using narcotic medications, you should take a dose prior to therapy to maximize you participation.  IF YOU ARE ON NARCOTIC MEDICATIONS IT IS NOT PERMISSIBLE TO OPERATE A MOTOR VEHICLE (MOTORCYCLE/CAR/TRUCK/MOPED) OR HEAVY MACHINERY DO NOT MIX NARCOTICS WITH OTHER CNS (CENTRAL NERVOUS SYSTEM) DEPRESSANTS SUCH AS ALCOHOL  Diet: as you were eating previously.  Can use over the counter stool softeners and bowel preparations, such as Miralax, to help with bowel movements.  Narcotics can be constipating.  Be sure to drink plenty of fluids    STOP SMOKING OR USING NICOTINE PRODUCTS!!!!  As discussed nicotine severely impairs your body's ability to heal surgical and traumatic wounds but also impairs bone healing.  Wounds and bone heal by forming microscopic blood vessels (angiogenesis) and nicotine is a vasoconstrictor (essentially, shrinks blood vessels).  Therefore, if vasoconstriction occurs to these microscopic blood vessels they essentially disappear and are unable to deliver necessary nutrients to the healing tissue.  This is one modifiable factor that you can do to dramatically increase your chances of healing your injury.    (This means no smoking, no nicotine gum, patches, etc)  DO NOT USE NONSTEROIDAL ANTI-INFLAMMATORY DRUGS (NSAID'S)  Using products such as Advil (ibuprofen), Aleve (naproxen), Motrin (  ibuprofen) for additional pain control during fracture healing can delay and/or prevent the healing response.  If you would like to take over the counter (OTC) medication, Tylenol (acetaminophen) is ok.  However, some narcotic medications that are given for pain control contain acetaminophen as well. Therefore,  you should not exceed more than 4000 mg of tylenol in a day if you do not have liver disease.  Also note that there are may OTC medicines, such as cold medicines and allergy medicines that my contain tylenol as well.  If you have any questions about medications and/or interactions please ask your doctor/PA or your pharmacist.      ICE AND ELEVATE INJURED/OPERATIVE EXTREMITY  Using ice and elevating the injured extremity above your heart can help with swelling and pain control.  Icing in a pulsatile fashion, such as 20 minutes on and 20 minutes off, can be followed.    Do not place ice directly on skin. Make sure there is a barrier between to skin and the ice pack.    Using frozen items such as frozen peas works well as the conform nicely to the are that needs to be iced.  USE AN ACE WRAP OR TED HOSE FOR SWELLING CONTROL  In addition to icing and elevation, Ace wraps or TED hose are used to help limit and resolve swelling.  It is recommended to use Ace wraps or TED hose until you are informed to stop.    When using Ace Wraps start the wrapping distally (farthest away from the body) and wrap proximally (closer to the body)   Example: If you had surgery on your leg or thing and you do not have a splint on, start the ace wrap at the toes and work your way up to the thigh        If you had surgery on your upper extremity and do not have a splint on, start the ace wrap at your fingers and work your way up to the upper arm  IF YOU ARE IN A SPLINT OR CAST DO NOT REMOVE IT FOR ANY REASON   If your splint gets wet for any reason please contact the office immediately. You may shower in your splint or cast as Bare as you keep it dry.  This can be done by wrapping in a cast cover or garbage back (or similar)  Do Not stick any thing down your splint or cast such as pencils, money, or hangers to try and scratch yourself with.  If you feel itchy take benadryl as prescribed on the bottle for itching  IF YOU ARE IN  A CAM BOOT (BLACK BOOT)  You may remove boot periodically. Perform daily dressing changes as noted below.  Wash the liner of the boot regularly and wear a sock when wearing the boot. It is recommended that you sleep in the boot until told otherwise  CALL THE OFFICE WITH ANY QUESTIONS OR CONCERNS: [phone]

## 2016-01-20 NOTE — Brief Op Note (Signed)
01/20/2016  12:17 PM  PATIENT:  Phillip Ortega  55 y.o. male  PRE-OPERATIVE DIAGNOSIS:   1. LEFT ELBOW SEVERE HETEROTOPIC OSSIFICATION,  2. LEFT ELBOW CONTRACTURE 3. SYNOSTOSIS RADIUS AND ULNA   POST-OPERATIVE DIAGNOSIS:   1. LEFT ELBOW SEVERE HETEROTOPIC OSSIFICATION,  2. LEFT ELBOW CONTRACTURE 3. SYNOSTOSIS RADIUS AND ULNA  PROCEDURE:  Procedure(s): 1. Radical EXCISION HETEROTOPIC OSSIFICATION LEFT ELBOW (Left) AND CAPSULECTOMY 2. REMOVAL PROXIMAL SYNOSTOSIS RADIUS AND ULNA 3. MANIPULATION ELBOW  SURGEON:  Surgeon(s) and Role:    * Myrene GalasMichael Cherrise Occhipinti, MD - Primary  PHYSICIAN ASSISTANT: Montez MoritaKEITH PAUL, PAC  ANESTHESIA:   regional and general  EBL:  Total I/O In: 2000 [I.V.:2000] Out: 50 [Blood:50]  BLOOD ADMINISTERED:none  DRAINS: none   LOCAL MEDICATIONS USED:  NONE  SPECIMEN:  No Specimen  DISPOSITION OF SPECIMEN:  N/A  COUNTS:  YES  TOURNIQUET:   Total Tourniquet Time Documented: Upper Arm (Left) - 116 minutes Upper Arm (Left) - 30 minutes Total: Upper Arm (Left) - 146 minutes   DICTATION: .Other Dictation: Dictation Number [id number]  PLAN OF CARE: Admit to inpatient   PATIENT DISPOSITION:  PACU - hemodynamically stable.   Delay start of Pharmacological VTE agent (>24hrs) due to surgical blood loss or risk of bleeding: not applicable

## 2016-01-20 NOTE — Anesthesia Postprocedure Evaluation (Signed)
Anesthesia Post Note  Patient: Phillip ClimesDavid W Ortega  Procedure(s) Performed: Procedure(s) (LRB): Radical EXCISION HETEROTOPIC OSSIFICATION LEFT ELBOW (Left)  Patient location during evaluation: PACU Anesthesia Type: General Level of consciousness: sedated, oriented and patient cooperative Pain management: pain level controlled Vital Signs Assessment: post-procedure vital signs reviewed and stable Respiratory status: spontaneous breathing, nonlabored ventilation, respiratory function stable and patient connected to nasal cannula oxygen Cardiovascular status: blood pressure returned to baseline and stable Postop Assessment: no signs of nausea or vomiting Anesthetic complications: no    Last Vitals:  Vitals:   01/20/16 1315 01/20/16 1330  BP: 120/83 123/74  Pulse: 71 75  Resp: 16 19  Temp:      Last Pain:  Vitals:   01/20/16 0809  TempSrc: Oral                 Arion Morgan,E. Mckaela Howley

## 2016-01-21 DIAGNOSIS — M24522 Contracture, left elbow: Secondary | ICD-10-CM | POA: Diagnosis not present

## 2016-01-21 NOTE — Evaluation (Signed)
Occupational Therapy Evaluation and Discharge Patient Details Name: Phillip BoardDavid W Hiley MRN: [id number] DOB: May 19, 1961 Today's Date: 01/21/2016    History of Present Illness This 55 yo male s/p Radical excision heterotopic ossification, left elbow with capsulectomy.Removal of proximal synostosis, radius and ulna. Manipulation of the elbow.   Clinical Impression   This 55 yo male admitted and underwent above presents to acute OT with all education complete, we will D/C from acute OT.    Follow Up Recommendations  Supervision - Intermittent (prn A)    Equipment Recommendations  None recommended by OT       Precautions / Restrictions Precautions Precautions: None Restrictions Weight Bearing Restrictions: No LUE Weight Bearing: Weight bearing as tolerated      Mobility Bed Mobility               General bed mobility comments: pt up standing in room upon my arrival  Transfers Overall transfer level: Independent                    Balance Overall balance assessment: Independent                                          ADL                                         General ADL Comments: family can A prn. Button up shirt he brought did not work due to size of bandage at elbow. I educated him and family that a t-shirt should work just fine (putting LUE in first, then head, then RUE)--reversing for removing shirt               Pertinent Vitals/Pain Pain Assessment: No/denies pain     Hand Dominance Right   Extremity/Trunk Assessment Upper Extremity Assessment Upper Extremity Assessment: LUE deficits/detail LUE Deficits / Details: elbow surgery this admission (forearm and elbow blocked by splinting/wrap) LUE Coordination: decreased gross motor           Communication Communication Communication: No difficulties   Cognition Arousal/Alertness: Awake/alert Behavior During Therapy: WFL for tasks  assessed/performed Overall Cognitive Status: Within Functional Limits for tasks assessed                     General Comments       Exercises   Other Exercises Other Exercises: encourged pt to keep his shoulder moving and his hand (recommended that he use a squeeze ball squeeze and to work on flattening it   Shoulder Instructions      Home Living Family/patient expects to be discharged to:: Private residence Living Arrangements: Spouse/significant other;Children Available Help at Discharge: Family;Available 24 hours/day Type of Home: House Home Access: Stairs to enter Entergy CorporationEntrance Stairs-Number of Steps: 4   Home Layout: One level     Bathroom Shower/Tub: IT trainerTub/shower unit;Curtain   Bathroom Toilet: Standard     Home Equipment: None          Prior Functioning/Environment Level of Independence: Independent        Comments: Works full time - computers    OT Diagnosis: Generalized weakness   OT Problem List: Decreased range of motion;Impaired UE functional use   OT Treatment/Interventions:      OT Goals(Current goals can be found in  the care plan section) Acute Rehab OT Goals Patient Stated Goal: home today  OT Frequency:                End of Session Equipment Utilized During Treatment:  (sling for LUE) Nurse Communication:  (pt ready to go from therapy standpoint)  Activity Tolerance: Patient tolerated treatment well Patient left: with family/visitor present (standing in room)   Time: [id number] OT Time Calculation (min): 9 min Charges:  OT General Charges $OT Visit: 1 Procedure OT Evaluation $OT Eval Low Complexity: 1 Procedure G-Codes: OT G-codes **NOT FOR INPATIENT CLASS** Functional Assessment Tool Used: clincal observation Functional Limitation: Self care Self Care Current Status (B2841(G8987): At least 1 percent but less than 20 percent impaired, limited or restricted Self Care Goal Status (L2440(G8988): At least 1 percent but less than 20 percent  impaired, limited or restricted Self Care Discharge Status [phone]([phone]): At least 1 percent but less than 20 percent impaired, limited or restricted  Evette GeorgesLeonard, Kriti Katayama Eva [phone] 01/21/2016, 4:19 PM

## 2016-01-21 NOTE — Care Management Note (Signed)
Case Management Note  Patient Details  Name: DAUNTE OESTREICH MRN: [id number] Date of Birth: [birth year]  Subjective/Objective: 55 yo M s/p radical excision heterotopic ossification L elbow                Action/Plan:   Expected Discharge Date: 01/21/16                 Expected Discharge Plan:  Home/Self Care  In-House Referral:     Discharge planning Services  CM Consult  Post Acute Care Choice:    Choice offered to:     DME Arranged:    DME Agency:     HH Arranged:    [place] Agency:     Status of Service:  Completed, signed off  If discussed at [employer] of Stay Meetings, dates discussed:    Additional Comments: met with pt at bedside. D/C plan is to return home with the support of his wife. He denies any d/c needs. Pt is able to get up without any assistance.  Norina Buzzard, RN 01/21/2016, 11:13 AM

## 2016-01-21 NOTE — Progress Notes (Signed)
Discharge instructions given. Pt verbalized understanding and all questions were answered.  

## 2016-01-21 NOTE — Discharge Summary (Signed)
Patient ID: Phillip Ortega MRN: [id number] DOB/AGE: 1960/07/17 55 y.o.  Admit date: 01/20/2016 Discharge date: 01/21/2016  Admission Diagnoses:  Active Problems:   Heterotopic ossification of bone   Discharge Diagnoses:  Same  Past Medical History:  Diagnosis Date  . Allergy    rhinitis  . Anxiety   . Depression   . Dislocation of left elbow 06/27/2015  . GERD (gastroesophageal reflux disease)   . History of kidney stones   . Hypertension   . PONV (postoperative nausea and vomiting)    Nausea  . Stroke (HCC) 06/2015  . TIA (transient ischemic attack)     Surgeries: Procedure(s): Radical EXCISION HETEROTOPIC OSSIFICATION LEFT ELBOW on 01/20/2016   Consultants:   Discharged Condition: Improved  Hospital Course: Phillip Ortega is an 55 y.o. male who was admitted 01/20/2016 for operative treatment of<principal problem not specified>. Patient has severe unremitting pain that affects sleep, daily activities, and work/hobbies. After pre-op clearance the patient was taken to the operating room on 01/20/2016 and underwent  Procedure(s): Radical EXCISION HETEROTOPIC OSSIFICATION LEFT ELBOW.    Patient was given perioperative antibiotics: Anti-infectives    Start     Dose/Rate Route Frequency Ordered Stop   01/20/16 1530  ceFAZolin (ANCEF) IVPB 1 g/50 mL premix     1 g 100 mL/hr over 30 Minutes Intravenous Every 6 hours 01/20/16 1501 01/21/16 0503   01/20/16 0915  ceFAZolin (ANCEF) 3 g in dextrose 5 % 50 mL IVPB     3 g 130 mL/hr over 30 Minutes Intravenous On call to O.R. 01/19/16 1409 01/20/16 0920       Patient was given sequential compression devices, early ambulation, and chemoprophylaxis to prevent DVT.  Patient benefited maximally from hospital stay and there were no complications.    Recent vital signs: Patient Vitals for the past 24 hrs:  BP Temp Temp src Pulse Resp SpO2  01/21/16 0507 137/82 98.4 F (36.9 C) Oral 83 18 95 %  01/21/16 0036 131/81 98.2 F (36.8 C)  Oral 80 18 96 %  01/20/16 2051 134/81 97.9 F (36.6 C) Oral 82 18 97 %  01/20/16 1451 121/78 97.9 F (36.6 C) Oral 77 18 99 %  01/20/16 1420 122/80 97.9 F (36.6 C) - 77 18 98 %  01/20/16 1400 122/77 - - 77 19 97 %  01/20/16 1330 123/74 - - 75 19 96 %  01/20/16 1315 120/83 - - 71 16 96 %  01/20/16 1300 112/83 - - 77 16 94 %  01/20/16 1245 116/83 - - 75 16 95 %  01/20/16 1228 115/77 98 F (36.7 C) - 78 - 92 %     Recent laboratory studies: No results for input(s): WBC, HGB, HCT, PLT, NA, K, CL, CO2, BUN, CREATININE, GLUCOSE, INR, CALCIUM in the last 72 hours.  Invalid input(s): PT, 2   Discharge Medications:     Medication List    STOP taking these medications   simvastatin 20 MG tablet Commonly known as:  ZOCOR   tamsulosin 0.4 MG Caps capsule Commonly known as:  FLOMAX     TAKE these medications   atorvastatin 80 MG tablet Commonly known as:  LIPITOR TAKE 1 TABLET (80 MG TOTAL) BY MOUTH DAILY. What changed:  See the new instructions.   buPROPion 300 MG 24 hr tablet Commonly known as:  WELLBUTRIN XL Take 300 mg by mouth daily. What changed:  Another medication with the same name was removed. Continue taking this medication, and follow the  directions you see here.   cetirizine 10 MG tablet Commonly known as:  ZYRTEC Take 1 tablet (10 mg total) by mouth daily. What changed:  when to take this  reasons to take this   FLUoxetine 20 MG capsule Commonly known as:  PROZAC Take 20 mg by mouth daily.   ketorolac 10 MG tablet Commonly known as:  TORADOL Take 1 tablet (10 mg total) by mouth every 6 (six) hours as needed for moderate pain.   methocarbamol 500 MG tablet Commonly known as:  ROBAXIN Take 1-2 tablets (500-1,000 mg total) by mouth every 6 (six) hours as needed for muscle spasms.   omeprazole 20 MG tablet Commonly known as:  PRILOSEC OTC Take 20 mg by mouth daily.   oxyCODONE 5 MG immediate release tablet Commonly known as:  ROXICODONE Take 1-2  tablets (5-10 mg total) by mouth every 6 (six) hours as needed for severe pain or breakthrough pain (take between percocet for breakthrough pain only).   oxyCODONE-acetaminophen 5-325 MG tablet Commonly known as:  ROXICET Take 1-2 tablets by mouth every 6 (six) hours as needed for severe pain.       Diagnostic Studies: Dg Elbow Complete Left  Result Date: 01/20/2016 CLINICAL DATA:  Excision heterotopic calcification EXAM: LEFT ELBOW - COMPLETE 3+ VIEW; DG C-ARM GT 120 MIN COMPARISON:  11/14/2015 FLUOROSCOPY TIME:  Radiation Exposure Index (as provided by the fluoroscopic device): Not available If the device does not provide the exposure index: Fluoroscopy Time:  15 seconds Number of Acquired Images:  4 FINDINGS: Postsurgical changes are noted in the proximal radius. Soft tissue calcifications were localized and removed. IMPRESSION: Operative removal of soft tissue calcifications. Electronically Signed   By: Alcide Clever M.D.   On: 01/20/2016 12:08   Dg C-arm Gt 120 Min  Result Date: 01/20/2016 CLINICAL DATA:  Excision heterotopic calcification EXAM: LEFT ELBOW - COMPLETE 3+ VIEW; DG C-ARM GT 120 MIN COMPARISON:  11/14/2015 FLUOROSCOPY TIME:  Radiation Exposure Index (as provided by the fluoroscopic device): Not available If the device does not provide the exposure index: Fluoroscopy Time:  15 seconds Number of Acquired Images:  4 FINDINGS: Postsurgical changes are noted in the proximal radius. Soft tissue calcifications were localized and removed. IMPRESSION: Operative removal of soft tissue calcifications. Electronically Signed   By: Alcide Clever M.D.   On: 01/20/2016 12:08    Disposition: 01-Home or Self Care  Discharge Instructions    Call MD / Call 911    Complete by:  As directed   If you experience chest pain or shortness of breath, CALL 911 and be transported to the hospital emergency room.  If you develope a fever above 101 F, pus (white drainage) or increased drainage or redness at the  wound, or calf pain, call your surgeon's office.   Constipation Prevention    Complete by:  As directed   Drink plenty of fluids.  Prune juice may be helpful.  You may use a stool softener, such as Colace (over the counter) 100 mg twice a day.  Use MiraLax (over the counter) for constipation as needed.   Diet - low sodium heart healthy    Complete by:  As directed   Diet general    Complete by:  As directed   Discharge instructions    Complete by:  As directed   Orthopaedic Trauma Service Discharge Instructions   General Discharge Instructions  WEIGHT BEARING STATUS: as tolerated left upper extremity   RANGE OF MOTION/ACTIVITY: as tolerated  left elbow  Wound Care: daily dressing changes starting on 01/22/2016 Discharge Wound Care Instructions  Do NOT apply any ointments, solutions or lotions to pin sites or surgical wounds.  These prevent needed drainage and even though solutions like hydrogen peroxide kill bacteria, they also damage cells lining the pin sites that help fight infection.  Applying lotions or ointments can keep the wounds moist and can cause them to breakdown and open up as well. This can increase the risk for infection. When in doubt call the office.  Surgical incisions should be dressed daily.  If any drainage is noted, use one layer of adaptic, then gauze, Kerlix, and an ace wrap.  Once the incision is completely dry and without drainage, it may be left open to air out.  Showering may begin 36-48 hours later.  Cleaning gently with soap and water.  Traumatic wounds should be dressed daily as well.    One layer of adaptic, gauze, Kerlix, then ace wrap.  The adaptic can be discontinued once the draining has ceased    If you have a wet to dry dressing: wet the gauze with saline the squeeze as much saline out so the gauze is moist (not soaking wet), place moistened gauze over wound, then place a dry gauze over the moist one, followed by Kerlix wrap, then ace wrap.  PAIN  MEDICATION USE AND EXPECTATIONS  You have likely been given narcotic medications to help control your pain.  After a traumatic event that results in an fracture (broken bone) with or without surgery, it is ok to use narcotic pain medications to help control one's pain.  We understand that everyone responds to pain differently and each individual patient will be evaluated on a regular basis for the continued need for narcotic medications. Ideally, narcotic medication use should last no more than 6-8 weeks (coinciding with fracture healing).   As a patient it is your responsibility as well to monitor narcotic medication use and report the amount and frequency you use these medications when you come to your office visit.   We would also advise that if you are using narcotic medications, you should take a dose prior to therapy to maximize you participation.  IF YOU ARE ON NARCOTIC MEDICATIONS IT IS NOT PERMISSIBLE TO OPERATE A MOTOR VEHICLE (MOTORCYCLE/CAR/TRUCK/MOPED) OR HEAVY MACHINERY DO NOT MIX NARCOTICS WITH OTHER CNS (CENTRAL NERVOUS SYSTEM) DEPRESSANTS SUCH AS ALCOHOL  Diet: as you were eating previously.  Can use over the counter stool softeners and bowel preparations, such as Miralax, to help with bowel movements.  Narcotics can be constipating.  Be sure to drink plenty of fluids    STOP SMOKING OR USING NICOTINE PRODUCTS!!!!  As discussed nicotine severely impairs your body's ability to heal surgical and traumatic wounds but also impairs bone healing.  Wounds and bone heal by forming microscopic blood vessels (angiogenesis) and nicotine is a vasoconstrictor (essentially, shrinks blood vessels).  Therefore, if vasoconstriction occurs to these microscopic blood vessels they essentially disappear and are unable to deliver necessary nutrients to the healing tissue.  This is one modifiable factor that you can do to dramatically increase your chances of healing your injury.    (This means no smoking,  no nicotine gum, patches, etc)  DO NOT USE NONSTEROIDAL ANTI-INFLAMMATORY DRUGS (NSAID'S)  Using products such as Advil (ibuprofen), Aleve (naproxen), Motrin (ibuprofen) for additional pain control during fracture healing can delay and/or prevent the healing response.  If you would like to take over the counter (  OTC) medication, Tylenol (acetaminophen) is ok.  However, some narcotic medications that are given for pain control contain acetaminophen as well. Therefore, you should not exceed more than 4000 mg of tylenol in a day if you do not have liver disease.  Also note that there are may OTC medicines, such as cold medicines and allergy medicines that my contain tylenol as well.  If you have any questions about medications and/or interactions please ask your doctor/PA or your pharmacist.      ICE AND ELEVATE INJURED/OPERATIVE EXTREMITY  Using ice and elevating the injured extremity above your heart can help with swelling and pain control.  Icing in a pulsatile fashion, such as 20 minutes on and 20 minutes off, can be followed.    Do not place ice directly on skin. Make sure there is a barrier between to skin and the ice pack.    Using frozen items such as frozen peas works well as the conform nicely to the are that needs to be iced.  USE AN ACE WRAP OR TED HOSE FOR SWELLING CONTROL  In addition to icing and elevation, Ace wraps or TED hose are used to help limit and resolve swelling.  It is recommended to use Ace wraps or TED hose until you are informed to stop.    When using Ace Wraps start the wrapping distally (farthest away from the body) and wrap proximally (closer to the body)   Example: If you had surgery on your leg or thing and you do not have a splint on, start the ace wrap at the toes and work your way up to the thigh        If you had surgery on your upper extremity and do not have a splint on, start the ace wrap at your fingers and work your way up to the upper arm  IF YOU ARE IN A  SPLINT OR CAST DO NOT REMOVE IT FOR ANY REASON   If your splint gets wet for any reason please contact the office immediately. You may shower in your splint or cast as [place] as you keep it dry.  This can be done by wrapping in a cast cover or garbage back (or similar)  Do Not stick any thing down your splint or cast such as pencils, money, or hangers to try and scratch yourself with.  If you feel itchy take benadryl as prescribed on the bottle for itching  IF YOU ARE IN A CAM BOOT (BLACK BOOT)  You may remove boot periodically. Perform daily dressing changes as noted below.  Wash the liner of the boot regularly and wear a sock when wearing the boot. It is recommended that you sleep in the boot until told otherwise  CALL THE OFFICE WITH ANY QUESTIONS OR CONCERNS: [phone]   Increase activity slowly    Complete by:  As directed   Increase activity slowly as tolerated    Complete by:  As directed      Follow-up Information    HANDY,MICHAEL H, MD In 10 days.   Specialty:  Orthopedic Surgery Why:  For suture removal, For wound re-check Contact information: [address] [place] [address] [phone]            Signed: Pascal Lux 01/21/2016, 10:50 AM

## 2016-01-23 ENCOUNTER — Encounter (HOSPITAL_COMMUNITY): Payer: Self-pay | Admitting: Orthopedic Surgery

## 2016-01-26 ENCOUNTER — Telehealth: Payer: Self-pay

## 2016-01-26 NOTE — Telephone Encounter (Signed)
-----   Message from Richarda OverlieJada A Fox, [place] sent at 10/26/2015 10:34 AM EDT ----- Lipid

## 2016-01-26 NOTE — Telephone Encounter (Signed)
Pt will come in to have labs drawn. Advised that lab tech will not be in office until Tuesday. Pt verbalized understanding

## 2016-01-27 ENCOUNTER — Ambulatory Visit (INDEPENDENT_AMBULATORY_CARE_PROVIDER_SITE_OTHER): Payer: BLUE CROSS/BLUE SHIELD | Admitting: Family Medicine

## 2016-01-27 ENCOUNTER — Encounter: Payer: Self-pay | Admitting: Family Medicine

## 2016-01-27 ENCOUNTER — Ambulatory Visit
Admission: RE | Admit: 2016-01-27 | Discharge: 2016-01-27 | Disposition: A | Discharge status: Home / Self Care | Payer: BLUE CROSS/BLUE SHIELD | Source: Ambulatory Visit | Attending: Family Medicine | Admitting: Family Medicine

## 2016-01-27 VITALS — BP 124/84 | HR 74 | Temp 97.9°F | Wt 282.4 lb

## 2016-01-27 DIAGNOSIS — M79671 Pain in right foot: Secondary | ICD-10-CM | POA: Diagnosis not present

## 2016-01-27 DIAGNOSIS — R269 Unspecified abnormalities of gait and mobility: Secondary | ICD-10-CM | POA: Diagnosis not present

## 2016-01-27 NOTE — Progress Notes (Signed)
Subjective:    Patient ID: Phillip Ortega, male    DOB: 01-13-1961, 55 y.o.   MRN: [id number]  HPI Chief Complaint  Patient presents with  . Foot Injury    foot pain- hurts in the arch   He is here with complaints of a 4-5 day history of right foot pain to dorsal midfoot area that started abruptly and for no known reason. Denies injury.  Describes pain as intermittent, non radiating. Arch with some "tightness".  Pain is worse with weight bearing and walking.  Pain is improved with rest. He is taking narcotic pain medication due to recent left arm surgery to repair a benign bone mass from a fracture. His left arm is in a sling.  No other concerns or complaints.   Denies fever, chills, chest pain, palpitations, shortness of breath, cough, calf pain, LE edema.     Past Medical History:  Diagnosis Date  . Allergy    rhinitis  . Anxiety   . Depression   . Dislocation of left elbow 06/27/2015  . GERD (gastroesophageal reflux disease)   . History of kidney stones   . Hypertension   . PONV (postoperative nausea and vomiting)    Nausea  . Stroke (HCC) 06/2015  . TIA (transient ischemic attack)    Past Surgical History:  Procedure Laterality Date  . BONE EXCISION Left 01/20/2016   Procedure: Radical EXCISION HETEROTOPIC OSSIFICATION LEFT ELBOW;  Surgeon: Myrene GalasMichael Handy, MD;  [facility];  Service: Orthopedics;  Laterality: Left;  . LAPAROSCOPIC CHOLECYSTECTOMY  07   T[place]  . ORIF ELBOW FRACTURE Left 06/25/2015   Procedure: OPEN REDUCTION INTERNAL FIXATION (ORIF) ELBOW/OLECRANON FRACTURE RADIO HEAD ARTHROPLASTY;  Surgeon: Myrene GalasMichael Handy, MD;  [facility];  Service: Orthopedics;  Laterality: Left;  . TEE WITHOUT CARDIOVERSION N/A 11/29/2015   Procedure: TRANSESOPHAGEAL ECHOCARDIOGRAM (TEE);  Surgeon: Jake BatheMark C Skains, MD;  [facility];  Service: Cardiovascular;  Laterality: N/A;   Current Outpatient Prescriptions on File Prior to Visit  Medication Sig Dispense Refill  .  atorvastatin (LIPITOR) 80 MG tablet TAKE 1 TABLET (80 MG TOTAL) BY MOUTH DAILY. (Patient taking differently: TAKE 1 TABLET (80 MG TOTAL) BY MOUTH EVERY EVENING.) 30 tablet 2  . buPROPion (WELLBUTRIN XL) 300 MG 24 hr tablet Take 300 mg by mouth daily.    . cetirizine (ZYRTEC) 10 MG tablet Take 1 tablet (10 mg total) by mouth daily. (Patient taking differently: Take 10 mg by mouth daily as needed for allergies. ) 30 tablet 2  . FLUoxetine (PROZAC) 20 MG capsule Take 20 mg by mouth daily.    Marland Kitchen. ketorolac (TORADOL) 10 MG tablet Take 1 tablet (10 mg total) by mouth every 6 (six) hours as needed for moderate pain. 20 tablet 0  . methocarbamol (ROBAXIN) 500 MG tablet Take 1-2 tablets (500-1,000 mg total) by mouth every 6 (six) hours as needed for muscle spasms. 90 tablet 0  . omeprazole (PRILOSEC OTC) 20 MG tablet Take 20 mg by mouth daily.    Marland Kitchen. oxyCODONE (ROXICODONE) 5 MG immediate release tablet Take 1-2 tablets (5-10 mg total) by mouth every 6 (six) hours as needed for severe pain or breakthrough pain (take between percocet for breakthrough pain only). 30 tablet 0  . oxyCODONE-acetaminophen (ROXICET) 5-325 MG tablet Take 1-2 tablets by mouth every 6 (six) hours as needed for severe pain. 70 tablet 0   Current Facility-Administered Medications on File Prior to Visit  Medication Dose Route Frequency Provider Last Rate Last  Dose  . ipratropium-albuterol (DUONEB) 0.5-2.5 (3) MG/3ML nebulizer solution 3 mL  3 mL Nebulization Once Jac Canavan, PA-C          Review of Systems Pertinent positives and negatives in the history of present illness.     Objective:   Physical Exam  Constitutional: He appears well-developed and well-nourished. No distress.  Left arm in a sling  Musculoskeletal:       Right foot: There is tenderness and bony tenderness. There is normal range of motion, no swelling and normal capillary refill.       Feet:  Marked tenderness to dorsal midfoot area without erythema, edema.  Pulses, ROM and strength normal.  No tenderness to plantar aspect.  Right lower extremity is neurovascularly intact. No calf tenderness, warmth or edema.  Negative homans sign.  Antalgic gait.    BP 124/84   Pulse 74   Temp 97.9 F (36.6 C) (Oral)   Wt 282 lb 6.4 oz (128.1 kg)   BMI 37.26 kg/m        Assessment & Plan:  Foot pain, right - Plan: DG Foot Complete Right  Abnormality of gait  Plan to order x-ray due to severe tenderness and inability to bear weight normally on that extremity.   Discussed using an anti-inflammatory and heat/ice to the area. Willl follow-up pending x-ray.

## 2016-02-14 ENCOUNTER — Telehealth: Payer: Self-pay

## 2016-02-14 DIAGNOSIS — I63411 Cerebral infarction due to embolism of right middle cerebral artery: Secondary | ICD-10-CM

## 2016-02-14 NOTE — Telephone Encounter (Signed)
-----   Message from Richarda OverlieJada A Fox, [place] sent at 11/18/2015 12:40 PM EDT ----- LFT and Lipid

## 2016-02-14 NOTE — Telephone Encounter (Signed)
Order's in system. Pt aware. He will come have labs done before appointment.

## 2016-02-16 ENCOUNTER — Other Ambulatory Visit: Payer: Self-pay | Admitting: Neurology

## 2016-02-21 ENCOUNTER — Ambulatory Visit (HOSPITAL_BASED_OUTPATIENT_CLINIC_OR_DEPARTMENT_OTHER): Payer: BLUE CROSS/BLUE SHIELD | Attending: Family Medicine | Admitting: Internal Medicine

## 2016-02-21 DIAGNOSIS — G4733 Obstructive sleep apnea (adult) (pediatric): Secondary | ICD-10-CM | POA: Diagnosis present

## 2016-02-21 DIAGNOSIS — Z9189 Other specified personal risk factors, not elsewhere classified: Secondary | ICD-10-CM

## 2016-02-21 DIAGNOSIS — G4736 Sleep related hypoventilation in conditions classified elsewhere: Secondary | ICD-10-CM | POA: Diagnosis not present

## 2016-02-22 ENCOUNTER — Telehealth: Payer: Self-pay | Admitting: Family Medicine

## 2016-02-22 ENCOUNTER — Telehealth (HOSPITAL_BASED_OUTPATIENT_CLINIC_OR_DEPARTMENT_OTHER): Payer: Self-pay | Admitting: Radiology

## 2016-02-22 NOTE — Telephone Encounter (Signed)
[facility] & told them that he had history of stroke in January but center did not see that indicated in pt's chart. Sleep Ctr proceeded with home testing even though stroke should be notated. If pt is referred for In Lab Sleep Study, stoke hx will need to be indicated.

## 2016-02-24 DIAGNOSIS — Z9189 Other specified personal risk factors, not elsewhere classified: Secondary | ICD-10-CM

## 2016-02-24 NOTE — Procedures (Signed)
  Patient Name: Phillip Ortega, [name] Date: 02/21/2016 Gender: Male D.O.B: 19-Sep-1960 Age (years): [age] Referring Provider: Ronnald NianJohn C Lalonde Height (inches): 72 Interpreting Physician: Jetty Duhamellinton Young MD, ABSM Weight (lbs): 270 RPSGT: [place], [place] BMI: 37 MRN: [id number] Neck Size: 18.00 CLINICAL INFORMATION Sleep Study Type:  unattended HST   Indication for sleep study: obstructive sleep apnea   Epworth Sleepiness Score: 13 SLEEP STUDY TECHNIQUE A multi-channel overnight portable sleep study was performed. The channels recorded were: nasal airflow, thoracic respiratory movement, and oxygen saturation with a pulse oximetry. Snoring was also monitored.  MEDICATIONS Patient self administered medications include: none reported during sleep study.  SLEEP ARCHITECTURE Patient was studied for 367.5 minutes. The sleep efficiency was 97.0 % and the patient was supine for 47%. The arousal index was 0.0 per hour.  RESPIRATORY PARAMETERS The overall AHI was 42.1 per hour, with a central apnea index of 0.0 per hour. The oxygen nadir was 81% during sleep.  CARDIAC DATA Mean heart rate during sleep was 72.8 bpm.  IMPRESSIONS - Severe obstructive sleep apnea occurred during this study (AHI = 42.1/h). - No significant central sleep apnea occurred during this study (CAI = 0.0/h). - Moderate oxygen desaturation was noted during this study (Min O2 = 81%).  DIAGNOSIS - Obstructive Sleep Apnea (327.23 [G47.33 ICD-10]) - Nocturnal Hypoxemia (327.26 [G47.36 ICD-10])  RECOMMENDATIONS - CPAP titration is the usual preferred initial therapy with scores in this range. - Positional therapy avoiding supine position during sleep. - Avoid alcohol, sedatives and other CNS depressants that may worsen sleep apnea and disrupt normal sleep architecture. - Sleep hygiene should be reviewed to assess factors that may improve sleep quality. - Weight management and regular exercise should be initiated or  continued.  [Electronically signed] 02/24/2016 02:27 PM  Jetty Duhamellinton Young MD, ABSM Diplomate, American Board of Sleep Medicine   NPI: [id number]  Waymon BudgeYOUNG,CLINTON D Diplomate, American Board of Sleep Medicine  ELECTRONICALLY SIGNED ON:  02/24/2016, 2:24 PM [facility] PH: [phone]   FX: [phone] ACCREDITED BY THE AMERICAN ACADEMY OF SLEEP MEDICINE

## 2016-03-07 ENCOUNTER — Telehealth: Payer: Self-pay | Admitting: Family Medicine

## 2016-03-07 NOTE — Telephone Encounter (Signed)
Left message for pt to call needs an appt per JCL.

## 2016-03-07 NOTE — Telephone Encounter (Signed)
Pt called and stated he has not heard about his sleep study. I located his sleep study in is chart. I will put a copy on your desk for you to review. Please call pt with results at [phone].

## 2016-03-08 ENCOUNTER — Ambulatory Visit (INDEPENDENT_AMBULATORY_CARE_PROVIDER_SITE_OTHER): Payer: BLUE CROSS/BLUE SHIELD | Admitting: Family Medicine

## 2016-03-08 DIAGNOSIS — Z23 Encounter for immunization: Secondary | ICD-10-CM | POA: Diagnosis not present

## 2016-03-08 DIAGNOSIS — G4733 Obstructive sleep apnea (adult) (pediatric): Secondary | ICD-10-CM | POA: Diagnosis not present

## 2016-03-08 NOTE — Patient Instructions (Signed)
Sleep Apnea  Sleep apnea is a sleep disorder characterized by abnormal pauses in breathing while you sleep. When your breathing pauses, the level of oxygen in your blood decreases. This causes you to move out of deep sleep and into light sleep. As a result, your quality of sleep is poor, and the system that carries your blood throughout your body (cardiovascular system) experiences stress. If sleep apnea remains untreated, the following conditions can develop:  High blood pressure (hypertension).  Coronary artery disease.  Inability to achieve or maintain an erection (impotence).  Impairment of your thought process (cognitive dysfunction). There are three types of sleep apnea: 1. Obstructive sleep apnea--Pauses in breathing during sleep because of a blocked airway. 2. Central sleep apnea--Pauses in breathing during sleep because the area of the brain that controls your breathing does not send the correct signals to the muscles that control breathing. 3. Mixed sleep apnea--A combination of both obstructive and central sleep apnea. RISK FACTORS The following risk factors can increase your risk of developing sleep apnea:  Being overweight.  Smoking.  Having narrow passages in your nose and throat.  Being of older age.  Being male.  Alcohol use.  Sedative and tranquilizer use.  Ethnicity. Among individuals younger than 35 years, African Americans are at increased risk of sleep apnea. SYMPTOMS   Difficulty staying asleep.  Daytime sleepiness and fatigue.  Loss of energy.  Irritability.  Loud, heavy snoring.  Morning headaches.  Trouble concentrating.  Forgetfulness.  Decreased interest in sex.  Unexplained sleepiness. DIAGNOSIS  In order to diagnose sleep apnea, your caregiver will perform a physical examination. A sleep study done in the comfort of your own home may be appropriate if you are otherwise healthy. Your caregiver may also recommend that you spend the  night in a sleep lab. In the sleep lab, several monitors record information about your heart, lungs, and brain while you sleep. Your leg and arm movements and blood oxygen level are also recorded. TREATMENT The following actions may help to resolve mild sleep apnea:  Sleeping on your side.   Using a decongestant if you have nasal congestion.   Avoiding the use of depressants, including alcohol, sedatives, and narcotics.   Losing weight and modifying your diet if you are overweight. There also are devices and treatments to help open your airway:  Oral appliances. These are custom-made mouthpieces that shift your lower jaw forward and slightly open your bite. This opens your airway.  Devices that create positive airway pressure. This positive pressure "splints" your airway open to help you breathe better during sleep. The following devices create positive airway pressure:  Continuous positive airway pressure (CPAP) device. The CPAP device creates a continuous level of air pressure with an air pump. The air is delivered to your airway through a mask while you sleep. This continuous pressure keeps your airway open.  Nasal expiratory positive airway pressure (EPAP) device. The EPAP device creates positive air pressure as you exhale. The device consists of single-use valves, which are inserted into each nostril and held in place by adhesive. The valves create very little resistance when you inhale but create much more resistance when you exhale. That increased resistance creates the positive airway pressure. This positive pressure while you exhale keeps your airway open, making it easier to breath when you inhale again.  Bilevel positive airway pressure (BPAP) device. The BPAP device is used mainly in patients with central sleep apnea. This device is similar to the CPAP device because   it also uses an air pump to deliver continuous air pressure through a mask. However, with the BPAP machine, the  pressure is set at two different levels. The pressure when you exhale is lower than the pressure when you inhale.  Surgery. Typically, surgery is only done if you cannot comply with less invasive treatments or if the less invasive treatments do not improve your condition. Surgery involves removing excess tissue in your airway to create a wider passage way.   This information is not intended to replace advice given to you by your health care provider. Make sure you discuss any questions you have with your health care provider.   Document Released: 05/11/2002 Document Revised: 06/11/2014 Document Reviewed: 09/27/2011 Elsevier Interactive Patient Education 2016 [employer]   

## 2016-03-08 NOTE — Progress Notes (Signed)
   Subjective:    Patient ID: Phillip Ortega, male    DOB: [birth year], 55 y.o.   MRN: [id number]  HPI He is here for consultation concerning recent sleep study. The sleep study did indeed show obstructive sleep apnea. He also had some evidence of nocturnal hypoxia. He also indicated that he tends to sleep better on his side.   Review of Systems     Objective:   Physical Exam Alert and in no distress otherwise not examined       Assessment & Plan:  OSA (obstructive sleep apnea)  Need for prophylactic vaccination and inoculation against influenza - Plan: Flu Vaccine QUAD 36+ mos IM  The sleep study was discussed with him in detail. I explained the concept behind sleep apnea. I will set him up for a CPAP auto titrate 5-15. Discussed sleeping on his side rather than on his back with the use of a special pillow or perhaps a tennis ball sewed into the nightshirt. I also strongly encouraged him to get involved in a weight reduction program. Hopefully with better sleep and energy, he will be able to accomplish this. Sleep hygiene information given and discussed with him. Flu shot given with risks and benefits discussed.

## 2016-05-07 ENCOUNTER — Telehealth: Payer: Self-pay

## 2016-05-07 NOTE — Telephone Encounter (Signed)
Called pt to find out why he is not using the cpap more left message for pt to call me back

## 2016-06-02 ENCOUNTER — Other Ambulatory Visit: Payer: Self-pay | Admitting: Neurology

## 2016-07-10 ENCOUNTER — Other Ambulatory Visit: Payer: Self-pay | Admitting: *Deleted

## 2016-07-10 ENCOUNTER — Other Ambulatory Visit: Payer: Self-pay | Admitting: Neurology

## 2016-07-10 MED ORDER — ATORVASTATIN CALCIUM 80 MG PO TABS: ORAL_TABLET | ORAL | 5 refills | Status: DC

## 2016-07-10 NOTE — Telephone Encounter (Signed)
Rx sent 

## 2016-07-10 NOTE — Telephone Encounter (Signed)
Patient is requesting refill on lipitor 80 mg.  Your last note says that you would rather him be on less than 70 mg.  Please advise.

## 2016-07-10 NOTE — Telephone Encounter (Signed)
Okay to refill? 

## 2016-08-11 ENCOUNTER — Other Ambulatory Visit: Payer: Self-pay | Admitting: Neurology

## 2016-08-26 ENCOUNTER — Other Ambulatory Visit: Payer: Self-pay | Admitting: Neurology

## 2016-09-03 ENCOUNTER — Other Ambulatory Visit: Payer: Self-pay | Admitting: Neurology

## 2016-09-08 ENCOUNTER — Other Ambulatory Visit: Payer: Self-pay | Admitting: Neurology

## 2016-09-10 NOTE — Telephone Encounter (Signed)
Called patient to advise recvd refill request from [facility] Recently [facility]. Called to see where Rx should be sent. No answer.

## 2016-11-16 ENCOUNTER — Ambulatory Visit (INDEPENDENT_AMBULATORY_CARE_PROVIDER_SITE_OTHER): Payer: BLUE CROSS/BLUE SHIELD | Admitting: Medical

## 2016-11-16 ENCOUNTER — Encounter: Payer: Self-pay | Admitting: Medical

## 2016-11-16 VITALS — BP 134/80 | HR 86 | Temp 98.0°F | Wt 303.6 lb

## 2016-11-16 DIAGNOSIS — R6883 Chills (without fever): Secondary | ICD-10-CM | POA: Diagnosis not present

## 2016-11-16 DIAGNOSIS — R52 Pain, unspecified: Secondary | ICD-10-CM

## 2016-11-16 DIAGNOSIS — J0100 Acute maxillary sinusitis, unspecified: Secondary | ICD-10-CM

## 2016-11-16 MED ORDER — AMOXICILLIN 500 MG PO TABS: ORAL_TABLET | ORAL | 0 refills | Status: DC

## 2016-11-16 NOTE — Progress Notes (Signed)
Subjective:  Phillip Ortega is a 56 y.o. male who presents for possible sinus infection.  He notes 4-5 day hx/o not feeling well, body aches, sometimes hot and sweaty, sometimes cold and shivering even out in hot days this week, having chills, facial sinus pressure.  No ear pain, no sore throat, no fever, no NVD, no rash, no known tick exposure, no known sick contacts.  No recent travel.  He has had sinus infections in the past, thinks this could be the problem.  No other aggravating or relieving factors.  No other c/o.  Past Medical History:  Diagnosis Date  . Allergy    rhinitis  . Anxiety   . Depression   . Dislocation of left elbow 06/27/2015  . GERD (gastroesophageal reflux disease)   . History of kidney stones   . Hypertension   . PONV (postoperative nausea and vomiting)    Nausea  . Stroke (HCC) 06/2015  . TIA (transient ischemic attack)    Current Outpatient Prescriptions on File Prior to Visit  Medication Sig Dispense Refill  . atorvastatin (LIPITOR) 80 MG tablet TAKE 1 TABLET (80 MG TOTAL) BY MOUTH DAILY. 30 tablet 5  . buPROPion (WELLBUTRIN XL) 300 MG 24 hr tablet Take 300 mg by mouth daily.    . cetirizine (ZYRTEC) 10 MG tablet Take 1 tablet (10 mg total) by mouth daily. (Patient taking differently: Take 10 mg by mouth daily as needed for allergies. ) 30 tablet 2  . FLUoxetine (PROZAC) 20 MG capsule Take 20 mg by mouth daily.     Current Facility-Administered Medications on File Prior to Visit  Medication Dose Route Frequency Provider Last Rate Last Dose  . ipratropium-albuterol (DUONEB) 0.5-2.5 (3) MG/3ML nebulizer solution 3 mL  3 mL Nebulization Once Tysinger, Kermit Balo, PA-C        ROS as in subjective   Objective: BP 134/80   Pulse 86   Temp 98 F (36.7 C)   Wt (!) 303 lb 9.6 oz (137.7 kg)   SpO2 98%   BMI 40.06 kg/m   General appearance: Alert, WD/WN, no distress                             Skin: warm, no rash                           Head: +maxillary  sinus tenderness,                            Eyes: conjunctiva normal, corneas clear, PERRLA                            Ears: pearly TMs, external ear canals normal                          Nose: septum midline, turbinates swollen, with erythema and clear discharge             Mouth/throat: MMM, tongue normal, mild pharyngeal erythema                           Neck: supple, no adenopathy, no thyromegaly, non tender  Heart: RRR, normal S1, S2, no murmurs                         Lungs: CTA bilaterally, no wheezes, rales, or rhonchi       Assessment  Encounter Diagnoses  Name Primary?  . Acute maxillary sinusitis, recurrence not specified Yes  . Chills   . Body aches       Plan: Discussed symptoms,  May be sinusitis. Begin Amoxicillin, rest, hydrate well.   Since he had no other obvious infection or other cause, we are presuming sinusitis given maxillary sinus tenderness and symptoms.   If not improving by next week, check labs, CBC diff, thyroid, recheck in general.    Phillip Ortega was seen today for allergic rhinitis .  Diagnoses and all orders for this visit:  Acute maxillary sinusitis, recurrence not specified  Chills  Body aches  Other orders -     amoxicillin (AMOXIL) 500 MG tablet; 2 tablets po BID x 10 days   Patient was advised to call or return if worse or not improving in the next few days.    Patient voiced understanding of diagnosis, recommendations, and treatment plan.

## 2017-04-05 DIAGNOSIS — F331 Major depressive disorder, recurrent, moderate: Secondary | ICD-10-CM | POA: Diagnosis not present

## 2017-04-19 DIAGNOSIS — F331 Major depressive disorder, recurrent, moderate: Secondary | ICD-10-CM | POA: Diagnosis not present

## 2017-05-03 DIAGNOSIS — F331 Major depressive disorder, recurrent, moderate: Secondary | ICD-10-CM | POA: Diagnosis not present

## 2017-05-17 DIAGNOSIS — F331 Major depressive disorder, recurrent, moderate: Secondary | ICD-10-CM | POA: Diagnosis not present

## 2017-05-30 DIAGNOSIS — F331 Major depressive disorder, recurrent, moderate: Secondary | ICD-10-CM | POA: Diagnosis not present

## 2017-06-14 DIAGNOSIS — F331 Major depressive disorder, recurrent, moderate: Secondary | ICD-10-CM | POA: Diagnosis not present

## 2017-06-18 DIAGNOSIS — F3342 Major depressive disorder, recurrent, in full remission: Secondary | ICD-10-CM | POA: Diagnosis not present

## 2017-07-08 ENCOUNTER — Telehealth: Payer: Self-pay | Admitting: Family Medicine

## 2017-07-08 NOTE — Telephone Encounter (Signed)
Called pt and left message to call our office to make an appointment to follow up on sleep apnea

## 2017-07-09 ENCOUNTER — Encounter: Payer: Self-pay | Admitting: Family Medicine

## 2017-07-09 ENCOUNTER — Ambulatory Visit: Payer: BLUE CROSS/BLUE SHIELD | Admitting: Family Medicine

## 2017-07-09 VITALS — BP 122/86 | HR 74 | Wt 316.4 lb

## 2017-07-09 DIAGNOSIS — G4733 Obstructive sleep apnea (adult) (pediatric): Secondary | ICD-10-CM | POA: Diagnosis not present

## 2017-07-09 NOTE — Progress Notes (Signed)
   Subjective:    Patient ID: Marzetta Boardavid W [place], male    DOB: 08/21/1960, 57 y.o.   MRN: [id number]  HPI He is here for consultation concerning underlying OSA.  He has had this for several years but has not been using his CPAP stating he had difficulty sleeping with it.  Further discussion with him indicates he is not at all interested in using the CPAP and would like to look into alternatives.  I explained that the only alternative would be for him to lose weight.  I explained the risk of untreated OSA in regard to hypertension, diabetes, heart disease etc.   Review of Systems     Objective:   Physical Exam Alert and in no distress otherwise not examined.       Assessment & Plan:  OSA (obstructive sleep apnea) - Plan: Amb ref to Medical Nutrition Therapy-[facility]  Morbid obesity (HCC) - Plan: Amb ref to Medical Nutrition Therapy-[facility] I will refer him to nutrition to help with dietary modification.  Also discussed exercise.  He plans to do this at work.  Recommended 20 minutes of something physical on a daily basis 150 minutes a week of anything physical.

## 2017-07-09 NOTE — Patient Instructions (Signed)
20 minutes of something physical every day.  150 minutes a week of something physical

## 2017-07-19 DIAGNOSIS — F331 Major depressive disorder, recurrent, moderate: Secondary | ICD-10-CM | POA: Diagnosis not present

## 2017-07-23 ENCOUNTER — Encounter: Payer: BLUE CROSS/BLUE SHIELD | Attending: Family Medicine | Admitting: Registered"

## 2017-07-23 ENCOUNTER — Encounter: Payer: Self-pay | Admitting: Registered"

## 2017-07-23 DIAGNOSIS — Z713 Dietary counseling and surveillance: Secondary | ICD-10-CM | POA: Diagnosis not present

## 2017-07-23 DIAGNOSIS — G4733 Obstructive sleep apnea (adult) (pediatric): Secondary | ICD-10-CM | POA: Insufficient documentation

## 2017-07-23 NOTE — Progress Notes (Signed)
Medical Nutrition Therapy:  Appt start time: 1430 end time:  1530.  Assessment:  Primary concerns today: Patient states he would like to improve diet and control hunger. Patient states he had a goal of not going to the hospital or doctor in 2018 because frequent issues requiring hospital/doctor visits in 2017 including a stroke (TIA per chart). Patient reports that tests were inconclusive as to cause of (TIA). At that time pt states his doctor told him he did not have diabetes. Per chart A1c was 6.1%.  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 Patient states he feels sluggish around 2 pm. Patient states he gets 6-7 hrs sleep per night. Pt states he has sleep apnea but cannot sleep with anything on his face so does not use CPAP.  RD asked patient to call after he has blood work done again and he can decide if he would like a follow-up appointment.  Preferred Learning Style:   No preference indicated   Learning Readiness:   Ready  MEDICATIONS: reviewed   DIETARY INTAKE:  24-hr recall:  B ( AM): cereal, pop tart, OJ OR on work days: sausage biscuit, tea  Snk ( AM): none  L ( PM): brings sandwich PB&J OR pimeto cheese OR microwavable soup OR work caf: grilled chicken, vegetable OR spaghetti OR sandwich, banana OR chips OR burger, chips, pudding or jello Snk ( PM): none (bored around 3 pm gets snickers 1-2 x week) D ( PM): (no time to cook) chicken tenders, rice or pasta dish, veggie (honey glazed carrots favorite) Wednesdays panarea  Snk ( PM):  Beverages: water, soda or sweet tea if tired at work or going out to eat  Usual physical activity: ADL  Estimated energy needs: 2000 calories 225 g carbohydrates 125 g protein 67 g fat  Progress Towards Goal(s):  In progress.   Nutritional Diagnosis:  NI-5.8.4 Inconsistent carbohydrate intake As related to high carb breakfast, large portons of rice, soda.  As evidenced by dietary  recall.    Intervention:  Nutrition Education. Discussed balanced eating. Discussed mindful eating and using a curious attitude to understand his non-hunger eating. Discussed lower sugar beverage options. discussed importance of exercise and adequate, restful sleep.  Plan: Consider trying Fairlife milk for a higher protein option Consider changing the portions of your foods to get better balance with your meals and snacks Consider cutting out the juice and soda, diet variety okay, Emergen-C sometimes Consider having only one cup of rice with meals Aim to increase your activity to average of 3-5x per week 30-45 minutes Consider calling your doctors office to see about having blood work done, at least a new A1c checked again, was 6.1% in 2017  Teaching Method Utilized:  Visual Auditory  Handouts given during visit include:  MyPlate Planner  Sleep hygiene  A1c chart  Barriers to learning/adherence to lifestyle change: none  Demonstrated degree of understanding via:  Teach Back   Monitoring/Evaluation:  Dietary intake, exercise, A1c and body weight prn.

## 2017-07-23 NOTE — Patient Instructions (Addendum)
Consider trying Fairlife milk for a higher protein option Consider changing the portions of your foods to get better balance with your meals and snacks Consider cutting out the juice and soda, diet variety okay, Emergen-C sometimes Consider having only one cup of rice with meals Aim to increase your activity to average of 3-5x per week 30-45 minutes Consider calling your doctors office to see about having blood work done, at least a new A1c checked again, was 6.1% in 2017

## 2017-08-08 DIAGNOSIS — F331 Major depressive disorder, recurrent, moderate: Secondary | ICD-10-CM | POA: Diagnosis not present

## 2017-08-30 DIAGNOSIS — F331 Major depressive disorder, recurrent, moderate: Secondary | ICD-10-CM | POA: Diagnosis not present

## 2017-09-19 DIAGNOSIS — F331 Major depressive disorder, recurrent, moderate: Secondary | ICD-10-CM | POA: Diagnosis not present

## 2017-10-09 ENCOUNTER — Encounter: Payer: Self-pay | Admitting: Family Medicine

## 2017-10-09 ENCOUNTER — Ambulatory Visit: Payer: BLUE CROSS/BLUE SHIELD | Admitting: Family Medicine

## 2017-10-09 VITALS — BP 122/80 | HR 80 | Temp 98.7°F | Resp 16 | Wt 310.0 lb

## 2017-10-09 DIAGNOSIS — R05 Cough: Secondary | ICD-10-CM | POA: Diagnosis not present

## 2017-10-09 DIAGNOSIS — J0140 Acute pansinusitis, unspecified: Secondary | ICD-10-CM

## 2017-10-09 DIAGNOSIS — J029 Acute pharyngitis, unspecified: Secondary | ICD-10-CM | POA: Diagnosis not present

## 2017-10-09 DIAGNOSIS — R059 Cough, unspecified: Secondary | ICD-10-CM

## 2017-10-09 LAB — POCT RAPID STREP A (OFFICE): Rapid Strep A Screen: NEGATIVE

## 2017-10-09 MED ORDER — AMOXICILLIN 875 MG PO TABS: 875.0000 mg | ORAL_TABLET | Freq: Two times a day (BID) | ORAL | 0 refills | Status: DC

## 2017-10-09 NOTE — Progress Notes (Signed)
Subjective:  Phillip Ortega is a 57 y.o. male who presents for a 5 day history of sore throat, rhinorrhea, nasal congestion, sinus pain, headache, and dry cough.  History of allergies and is taking daily antihistamine.   Denies fever, chest pain, shortness of breath, N/V/D.   Treatment to date: antihistamines, cough suppressants and decongestants and Ibuprofen.  Denies sick contacts.  No other aggravating or relieving factors.  No other c/o.  ROS as in subjective.   Objective: Vitals:   10/09/17 1028  BP: 122/80  Pulse: 80  Resp: 16  Temp: 98.7 F (37.1 C)  SpO2: 96%    General appearance: Alert, WD/WN, no distress, mildly ill appearing                             Skin: warm, no rash                           Head: +frontal and maxillary sinus tenderness                            Eyes: conjunctiva normal, corneas clear, PERRLA                            Ears: pearly TMs, external ear canals normal                          Nose: septum midline, turbinates swollen, with erythema and thick discharge             Mouth/throat: MMM, tongue normal, mild pharyngeal erythema without edema or exudate                            Neck: supple, mild anterior cervical adenopathy, mild tenderness, no thyromegaly                          Heart: RRR, normal S1, S2, no murmurs                         Lungs: CTA bilaterally, no wheezes, rales, or rhonchi      Assessment: Acute pansinusitis, recurrence not specified - Plan: amoxicillin (AMOXIL) 875 MG tablet  Cough  Acute pharyngitis, unspecified etiology - Plan: POCT rapid strep A    Plan: Rapid strep test negative.  Discussed diagnosis and treatment of acute sinusitis and acute pharyngitis. Amoxicillin prescribed, he declines Augmentin. Suggested symptomatic OTC remedies. Advised salt water gargles, Ibuprofen, Mucinex DM or Robitussin DM. Nasal saline spray for congestion. Follow up if symptoms worsen or if not back to baseline after  completing antibiotic.

## 2017-10-11 DIAGNOSIS — F331 Major depressive disorder, recurrent, moderate: Secondary | ICD-10-CM | POA: Diagnosis not present

## 2017-10-23 ENCOUNTER — Encounter: Payer: Self-pay | Admitting: Family Medicine

## 2017-10-23 ENCOUNTER — Ambulatory Visit: Payer: BLUE CROSS/BLUE SHIELD | Admitting: Family Medicine

## 2017-10-23 VITALS — BP 120/70 | HR 91 | Temp 98.3°F | Resp 16 | Ht 74.0 in | Wt 313.0 lb

## 2017-10-23 DIAGNOSIS — R05 Cough: Secondary | ICD-10-CM | POA: Diagnosis not present

## 2017-10-23 DIAGNOSIS — J3089 Other allergic rhinitis: Secondary | ICD-10-CM | POA: Diagnosis not present

## 2017-10-23 DIAGNOSIS — R059 Cough, unspecified: Secondary | ICD-10-CM

## 2017-10-23 DIAGNOSIS — J0141 Acute recurrent pansinusitis: Secondary | ICD-10-CM

## 2017-10-23 MED ORDER — BENZONATATE 200 MG PO CAPS: 200.0000 mg | ORAL_CAPSULE | Freq: Two times a day (BID) | ORAL | 0 refills | Status: DC | PRN

## 2017-10-23 MED ORDER — DOXYCYCLINE HYCLATE 100 MG PO TABS: 100.0000 mg | ORAL_TABLET | Freq: Two times a day (BID) | ORAL | 0 refills | Status: DC

## 2017-10-23 NOTE — Patient Instructions (Addendum)
I am putting you on doxycycline.  Take this as prescribed.  Stay well-hydrated. I recommend that you start treating your allergies as well.  You may want to use Flonase or Nasacort and the Zyrtec daily. You may use Mucinex DM during the day.  Follow-up if you are not 100% back to baseline after completing the antibiotic.

## 2017-10-23 NOTE — Progress Notes (Signed)
Chief Complaint  Patient presents with  . sinus infection    sinus infection. coughing., sinus pressure, headache    Subjective:  Phillip Ortega is a 57 y.o. male who presents for URI symptoms including rhinorrhea, nasal congestion, sinus pressure, post nasal drainage, and dry cough.  He completed a course of Amoxicillin and reports that he was feeling 80% improved for a few days and then symptoms returned with worsening cough.   Denies fever, chills, sore throat, chest pain, shortness of breath, wheezing, abdominal pain, N/V/D, LE edema.   History of untreated OSA and he is not interested in using a CPAP.   Treatment to date: Robitussin DM, ibuprofen.  Denies sick contacts.  No other aggravating or relieving factors.  No other c/o.  ROS as in subjective.   Objective: Vitals:   10/23/17 1012  BP: 120/70  Pulse: 91  Resp: 16  Temp: 98.3 F (36.8 C)  SpO2: 97%    General appearance: Alert, WD/WN, no distress, mildly ill appearing                             Skin: warm, no rash                           Head: + frontal and maxillary sinus tenderness                            Eyes: conjunctiva normal, corneas clear, PERRLA                            Ears: pearly TMs, external ear canals normal                          Nose: septum midline, turbinates swollen, with erythema and clear discharge             Mouth/throat: MMM, tongue normal, mild pharyngeal erythema                           Neck: supple, no adenopathy, no thyromegaly, nontender                          Heart: RRR, normal S1, S2, no murmurs                         Lungs: CTA bilaterally, no wheezes, rales, or rhonchi      Assessment: Acute recurrent pansinusitis - Plan: doxycycline (VIBRA-TABS) 100 MG tablet  Cough - Plan: benzonatate (TESSALON) 200 MG capsule  Environmental and seasonal allergies    Plan: Discussed diagnosis and treatment of acute sinusitis and cough.  Failed Amoxil. Does not want  Augmentin due to past experience. Will try Doxycycline. Tessalon prescribed for cough. May use Mucinex DM or Robitussin DM as well. Increase water.  Recommend he start on allergy treatment.  Suggested symptomatic OTC remedies. Nasal saline spray for congestion.  Tylenol or Ibuprofen OTC for fever and malaise.  Advised potential health risks of untreated OSA and states he is aware.

## 2017-10-29 DIAGNOSIS — F331 Major depressive disorder, recurrent, moderate: Secondary | ICD-10-CM | POA: Diagnosis not present

## 2017-11-12 DIAGNOSIS — F331 Major depressive disorder, recurrent, moderate: Secondary | ICD-10-CM | POA: Diagnosis not present

## 2017-11-12 DIAGNOSIS — F900 Attention-deficit hyperactivity disorder, predominantly inattentive type: Secondary | ICD-10-CM | POA: Diagnosis not present

## 2017-11-15 DIAGNOSIS — F331 Major depressive disorder, recurrent, moderate: Secondary | ICD-10-CM | POA: Diagnosis not present

## 2017-11-21 ENCOUNTER — Ambulatory Visit: Payer: BLUE CROSS/BLUE SHIELD | Admitting: Family Medicine

## 2017-11-21 ENCOUNTER — Encounter: Payer: Self-pay | Admitting: Family Medicine

## 2017-11-21 VITALS — BP 110/76 | HR 81 | Temp 97.4°F | Wt 301.0 lb

## 2017-11-21 DIAGNOSIS — R509 Fever, unspecified: Secondary | ICD-10-CM | POA: Diagnosis not present

## 2017-11-21 DIAGNOSIS — R5383 Other fatigue: Secondary | ICD-10-CM

## 2017-11-21 LAB — COMPREHENSIVE METABOLIC PANEL
ALT: 22 IU/L (ref 0–44)
AST: 17 IU/L (ref 0–40)
Albumin/Globulin Ratio: 1 — ABNORMAL LOW (ref 1.2–2.2)
Albumin: 3.8 g/dL (ref 3.5–5.5)
Alkaline Phosphatase: 68 IU/L (ref 39–117)
BUN/Creatinine Ratio: 11 (ref 9–20)
BUN: 11 mg/dL (ref 6–24)
Bilirubin Total: 0.6 mg/dL (ref 0.0–1.2)
CALCIUM: 9 mg/dL (ref 8.7–10.2)
CO2: 22 mmol/L (ref 20–29)
CREATININE: 0.96 mg/dL (ref 0.76–1.27)
Chloride: 102 mmol/L (ref 96–106)
GFR calc Af Amer: 102 mL/min/{1.73_m2} (ref >59)
GFR, EST NON AFRICAN AMERICAN: 88 mL/min/{1.73_m2} (ref >59)
GLUCOSE: 103 mg/dL — AB (ref 65–99)
Globulin, Total: 3.9 g/dL (ref 1.5–4.5)
Potassium: 4.4 mmol/L (ref 3.5–5.2)
Sodium: 141 mmol/L (ref 134–144)
Total Protein: 7.7 g/dL (ref 6.0–8.5)

## 2017-11-21 LAB — CBC WITH DIFFERENTIAL/PLATELET
Basophils Absolute: 0.1 10*3/uL (ref 0.0–0.2)
Basos: 0 %
EOS (ABSOLUTE): 0.3 10*3/uL (ref 0.0–0.4)
Eos: 2 %
Hematocrit: 44.4 % (ref 37.5–51.0)
Hemoglobin: 14.9 g/dL (ref 13.0–17.7)
Immature Grans (Abs): 0.1 10*3/uL (ref 0.0–0.1)
Immature Granulocytes: 1 %
LYMPHS ABS: 2.3 10*3/uL (ref 0.7–3.1)
Lymphs: 18 %
MCH: 27.6 pg (ref 26.6–33.0)
MCHC: 33.6 g/dL (ref 31.5–35.7)
MCV: 82 fL (ref 79–97)
MONOS ABS: 1.1 10*3/uL — AB (ref 0.1–0.9)
Monocytes: 9 %
NEUTROS ABS: 8.8 10*3/uL — AB (ref 1.4–7.0)
Neutrophils: 70 %
Platelets: 251 10*3/uL (ref 150–450)
RBC: 5.39 x10E6/uL (ref 4.14–5.80)
RDW: 14.7 % (ref 12.3–15.4)
WBC: 12.6 10*3/uL — AB (ref 3.4–10.8)

## 2017-11-21 NOTE — Patient Instructions (Signed)
Increase your ibuprofen to 4 tablets 3 times per day

## 2017-11-21 NOTE — Progress Notes (Signed)
   Subjective:    Patient ID: Phillip Ortega, male    DOB: [birth year], 57 y.o.   MRN: [id number]  HPI He complains of a 6-day history that started with fatigue, chills, myalgias, diaphoresis, abdominal distress and some anorexia.  He did not complain of sore throat, earache and only a slight cough. He also then mentioned depression however he is presently not on any medication.  In the past he is apparently been on Wellbutrin.  Review of Systems     Objective:   Physical Exam Alert and in no distress. Tympanic membranes and canals are normal. Pharyngeal area is normal. Neck is supple without adenopathy or thyromegaly. Cardiac exam shows a regular sinus rhythm without murmurs or gallops. Lungs are clear to auscultation. Abdominal exam showed decreased bowel sounds without masses or tenderness       Assessment & Plan:  Fever and chills - Plan: CBC with Differential/Platelet, Comprehensive metabolic panel  Fatigue, unspecified type - Plan: CBC with Differential/Platelet, Comprehensive metabolic panel  His symptoms are consistent with a viral illness but this seems to have lingered on for quite some time. I will do routine blood work to make sure not missing anything and did ask him to return for further discussion of his depression.

## 2017-11-22 ENCOUNTER — Ambulatory Visit: Payer: BLUE CROSS/BLUE SHIELD | Admitting: Family Medicine

## 2017-11-29 DIAGNOSIS — F331 Major depressive disorder, recurrent, moderate: Secondary | ICD-10-CM | POA: Diagnosis not present

## 2017-12-12 DIAGNOSIS — F331 Major depressive disorder, recurrent, moderate: Secondary | ICD-10-CM | POA: Diagnosis not present

## 2017-12-12 DIAGNOSIS — F900 Attention-deficit hyperactivity disorder, predominantly inattentive type: Secondary | ICD-10-CM | POA: Diagnosis not present

## 2017-12-13 DIAGNOSIS — F331 Major depressive disorder, recurrent, moderate: Secondary | ICD-10-CM | POA: Diagnosis not present

## 2017-12-23 ENCOUNTER — Ambulatory Visit: Payer: BLUE CROSS/BLUE SHIELD | Admitting: Medical

## 2017-12-23 VITALS — BP 140/90 | HR 78 | Temp 97.5°F | Resp 18 | Ht 73.0 in | Wt 299.8 lb

## 2017-12-23 DIAGNOSIS — G4489 Other headache syndrome: Secondary | ICD-10-CM | POA: Diagnosis not present

## 2017-12-23 DIAGNOSIS — R6883 Chills (without fever): Secondary | ICD-10-CM

## 2017-12-23 DIAGNOSIS — J029 Acute pharyngitis, unspecified: Secondary | ICD-10-CM | POA: Diagnosis not present

## 2017-12-23 MED ORDER — HYDROCODONE-ACETAMINOPHEN 5-325 MG PO TABS: 1.0000 | ORAL_TABLET | Freq: Four times a day (QID) | ORAL | 0 refills | Status: DC | PRN

## 2017-12-23 NOTE — Patient Instructions (Signed)
Recommendations  Rest, drink plenty of water over the next few days  Use over-the-counter ibuprofen 200 mg, 3 tablets twice daily for the next few days  If your pain is worse at times you can use the hydrocodone pain pill that I sent to the pharmacy for worse pain  I do want you to use over-the-counter Benadryl or Mucinex the next few days in the event this is just an upper respiratory infection  If you have worse symptoms, new symptoms, or not seeing any improvement by Wednesday or Thursday give us a call back  Next steps may include labs or other testing  If this is just an acute respiratory infection symptoms should not get much worse but should gradually improve over the week

## 2017-12-23 NOTE — Progress Notes (Addendum)
Subjective: Chief Complaint  Patient presents with  . headache    headaches, chills X friday   Here for headache x 3 days.   He notes recurrence of chills that he had 3 weeks ago.  Saw Dr. Susann GivensLalonde for this 3 weeks ago, but had gotten better.  This past Friday 3 days ago had started with headache and chills.   chills have improved a bit.  Has sore throat x 1 day.   + body aches.   No cough, no vomiting, but has had some nausea.   The headache are sharp, mostly frontal above eyes.  Does get some headaches on occasionally.  Has had some migraines in the past.     He notes 06/2015 had TIA, back at that time lost control of his feet, fell in parking lot.   Ended up being treated in the ED for possible stroke.   No recent slurred speech, confusion, paresthesias, weakness, gait changes.  No other aggravating or relieving factors. No other complaint.   Past Medical History:  Diagnosis Date  . Allergy    rhinitis  . Anxiety   . Depression   . Dislocation of left elbow 06/27/2015  . GERD (gastroesophageal reflux disease)   . History of kidney stones   . Hypertension   . Major depression 2013   Milagros Evenerupinder Kaur MD  . PONV (postoperative nausea and vomiting)    Nausea  . Sleep apnea 2013   Milagros Evenerupinder Kaur MD  . Stroke [facility](HCC) 06/2015  . TIA (transient ischemic attack)    No current outpatient medications on file prior to visit.   No current facility-administered medications on file prior to visit.    ROS as in subjective   Objective: BP 140/90   Pulse 78   Temp (!) 97.5 F (36.4 C) (Oral)   Resp 18   Ht 6\' 1"  (1.854 m)   Wt 299 lb 12.8 oz (136 kg)   SpO2 97%   BMI 39.55 kg/m   BP Readings from Last 3 Encounters:  12/23/17 140/90  11/21/17 110/76  10/23/17 120/70   General appearance: alert, no distress, WD/WN, obese white male HEENT: normocephalic, sclerae anicteric, PERRLA, EOMi, nares with mild turbinated edema, clear discharge, pharynx with mild erythema Oral cavity: MMM, no  lesions Neck: supple, no lymphadenopathy, no thyromegaly, no masses Heart: RRR, normal S1, S2, no murmurs Lungs: CTA bilaterally, no wheezes, rhonchi, or rales Extremities: no edema, no cyanosis, no clubbing Pulses: 2+ symmetric, upper and lower extremities, normal cap refill Neurological: alert, oriented x 3, CN2-12 intact, strength normal upper extremities and lower extremities, sensation normal throughout, DTRs 2+ throughout, no cerebellar signs, gait normal Psychiatric: normal affect, behavior normal, pleasant     Assessment: Encounter Diagnoses  Name Primary?  . Headache syndrome Yes  . Sore throat   . Chills      Plan: Discussed symptoms and concerns.  Etiology unclear, possible respiratory tract infection or viral syndrome.  However some of the symptoms suggest trigeminal neuralgia.  No obvious signs today of serious underlying illness and no signs of TIA or stroke today.  Reviewed labs in chart from 11/2017  Will use the recommendations below as discussed, but if not improving or worse the next few days may need to get labs and possible head imaging  He agrees with the following recommendations and follow-up  Patient Instructions  Recommendations  Rest, drink plenty of water over the next few days  Use over-the-counter ibuprofen 200 mg, 3 tablets twice  daily for the next few days  If your pain is worse at times you can use the hydrocodone pain pill that I sent to the pharmacy for worse pain  I do want you to use over-the-counter Benadryl or Mucinex the next few days in the event this is just an upper respiratory infection  If you have worse symptoms, new symptoms, or not seeing any improvement by Wednesday or Thursday give [place] a call back  Next steps may include labs or other testing  If this is just an acute respiratory infection symptoms should not get much worse but should gradually improve over the week   Canio was seen today for headache.  Diagnoses and all  orders for this visit:  Headache syndrome  Sore throat  Chills  Other orders -     HYDROcodone-acetaminophen (NORCO) 5-325 MG tablet; Take 1 tablet by mouth every 6 (six) hours as needed for moderate pain.

## 2018-01-03 DIAGNOSIS — F331 Major depressive disorder, recurrent, moderate: Secondary | ICD-10-CM | POA: Diagnosis not present

## 2018-01-24 DIAGNOSIS — F331 Major depressive disorder, recurrent, moderate: Secondary | ICD-10-CM | POA: Diagnosis not present

## 2018-01-29 DIAGNOSIS — F3342 Major depressive disorder, recurrent, in full remission: Secondary | ICD-10-CM | POA: Diagnosis not present

## 2018-01-29 DIAGNOSIS — F900 Attention-deficit hyperactivity disorder, predominantly inattentive type: Secondary | ICD-10-CM | POA: Diagnosis not present

## 2018-02-14 DIAGNOSIS — F331 Major depressive disorder, recurrent, moderate: Secondary | ICD-10-CM | POA: Diagnosis not present

## 2018-02-28 DIAGNOSIS — F331 Major depressive disorder, recurrent, moderate: Secondary | ICD-10-CM | POA: Diagnosis not present

## 2018-03-14 DIAGNOSIS — F331 Major depressive disorder, recurrent, moderate: Secondary | ICD-10-CM | POA: Diagnosis not present

## 2018-03-18 ENCOUNTER — Ambulatory Visit: Payer: BLUE CROSS/BLUE SHIELD | Admitting: Family Medicine

## 2018-03-18 VITALS — BP 144/96 | HR 92 | Temp 98.0°F | Wt 299.0 lb

## 2018-03-18 DIAGNOSIS — J209 Acute bronchitis, unspecified: Secondary | ICD-10-CM

## 2018-03-18 DIAGNOSIS — J302 Other seasonal allergic rhinitis: Secondary | ICD-10-CM | POA: Diagnosis not present

## 2018-03-18 DIAGNOSIS — G4733 Obstructive sleep apnea (adult) (pediatric): Secondary | ICD-10-CM | POA: Diagnosis not present

## 2018-03-18 DIAGNOSIS — K120 Recurrent oral aphthae: Secondary | ICD-10-CM | POA: Diagnosis not present

## 2018-03-18 MED ORDER — AZITHROMYCIN 500 MG PO TABS
500.0000 mg | ORAL_TABLET | Freq: Every day | ORAL | 0 refills | Status: DC
Start: 2018-03-18 — End: 2023-07-09

## 2018-03-18 NOTE — Progress Notes (Signed)
   Subjective:    Patient ID: Phillip Ortega, male    DOB: 02-26-1961, 57 y.o.   MRN: [id number]  HPI Last Friday he had the onset of nasal congestion and headache followed by chest congestion, cough, sore throat, fever, chills and slight shortness of breath.  He also complains of aphthous ulcers.  He has a Norling history of difficulty with this. He does have underlying OSA but has not been able to use the CPAP.  He is using his CPAP.  He is also been using ibuprofen and Robitussin.   Review of Systems     Objective:   Physical Exam Alert and toxic appearing. Tympanic membranes and canals are normal. Pharyngeal area is normal, 2 ulcerated lesions noted on his left cheek.. Neck is supple without adenopathy or thyromegaly. Cardiac exam shows a regular sinus rhythm without murmurs or gallops. Lungs are clear to auscultation.       Assessment & Plan:  Acute bronchitis, unspecified organism - Plan: CBC with Differential/Platelet, Comprehensive metabolic panel, azithromycin (ZITHROMAX) 500 MG tablet  OSA (obstructive sleep apnea)  Seasonal allergic rhinitis, unspecified trigger  Aphthous ulcer of mouth I explained that this all could be viral but I think it is worthwhile to see if you would respond to the azithromycin.  Because he looks toxic, I will also get some of the studies done. No therapy at the present time for the aphthous ulcer.  Follow-up pending blood work.

## 2018-03-19 LAB — CBC WITH DIFFERENTIAL/PLATELET
Basophils Absolute: 0.1 10*3/uL (ref 0.0–0.2)
Basos: 1 %
EOS (ABSOLUTE): 0.1 10*3/uL (ref 0.0–0.4)
Eos: 1 %
HEMOGLOBIN: 16 g/dL (ref 13.0–17.7)
Hematocrit: 46.9 % (ref 37.5–51.0)
IMMATURE GRANS (ABS): 0.1 10*3/uL (ref 0.0–0.1)
Immature Granulocytes: 1 %
LYMPHS ABS: 1.3 10*3/uL (ref 0.7–3.1)
LYMPHS: 13 %
MCH: 28 pg (ref 26.6–33.0)
MCHC: 34.1 g/dL (ref 31.5–35.7)
MCV: 82 fL (ref 79–97)
Monocytes Absolute: 0.8 10*3/uL (ref 0.1–0.9)
Monocytes: 7 %
NEUTROS ABS: 8.4 10*3/uL — AB (ref 1.4–7.0)
Neutrophils: 77 %
PLATELETS: 241 10*3/uL (ref 150–450)
RBC: 5.72 x10E6/uL (ref 4.14–5.80)
RDW: 14.3 % (ref 12.3–15.4)
WBC: 10.7 10*3/uL (ref 3.4–10.8)

## 2018-03-19 LAB — COMPREHENSIVE METABOLIC PANEL
A/G RATIO: 1.2 (ref 1.2–2.2)
ALBUMIN: 4.2 g/dL (ref 3.5–5.5)
ALT: 24 IU/L (ref 0–44)
AST: 23 IU/L (ref 0–40)
Alkaline Phosphatase: 70 IU/L (ref 39–117)
BILIRUBIN TOTAL: 0.6 mg/dL (ref 0.0–1.2)
BUN / CREAT RATIO: 14 (ref 9–20)
BUN: 13 mg/dL (ref 6–24)
CHLORIDE: 98 mmol/L (ref 96–106)
CO2: 23 mmol/L (ref 20–29)
Calcium: 9.2 mg/dL (ref 8.7–10.2)
Creatinine, Ser: 0.95 mg/dL (ref 0.76–1.27)
GFR calc non Af Amer: 89 mL/min/{1.73_m2} (ref >59)
GFR, EST AFRICAN AMERICAN: 103 mL/min/{1.73_m2} (ref >59)
Globulin, Total: 3.6 g/dL (ref 1.5–4.5)
Glucose: 116 mg/dL — ABNORMAL HIGH (ref 65–99)
POTASSIUM: 4.2 mmol/L (ref 3.5–5.2)
Sodium: 139 mmol/L (ref 134–144)
TOTAL PROTEIN: 7.8 g/dL (ref 6.0–8.5)

## 2018-04-04 DIAGNOSIS — F331 Major depressive disorder, recurrent, moderate: Secondary | ICD-10-CM | POA: Diagnosis not present

## 2018-04-17 DIAGNOSIS — F331 Major depressive disorder, recurrent, moderate: Secondary | ICD-10-CM | POA: Diagnosis not present

## 2018-04-28 DIAGNOSIS — F331 Major depressive disorder, recurrent, moderate: Secondary | ICD-10-CM | POA: Diagnosis not present

## 2018-05-09 DIAGNOSIS — F331 Major depressive disorder, recurrent, moderate: Secondary | ICD-10-CM | POA: Diagnosis not present

## 2018-05-23 DIAGNOSIS — F331 Major depressive disorder, recurrent, moderate: Secondary | ICD-10-CM | POA: Diagnosis not present

## 2018-06-06 DIAGNOSIS — F331 Major depressive disorder, recurrent, moderate: Secondary | ICD-10-CM | POA: Diagnosis not present

## 2018-06-20 DIAGNOSIS — F331 Major depressive disorder, recurrent, moderate: Secondary | ICD-10-CM | POA: Diagnosis not present

## 2018-07-04 DIAGNOSIS — F331 Major depressive disorder, recurrent, moderate: Secondary | ICD-10-CM | POA: Diagnosis not present

## 2018-07-23 DIAGNOSIS — F3342 Major depressive disorder, recurrent, in full remission: Secondary | ICD-10-CM | POA: Diagnosis not present

## 2018-07-23 DIAGNOSIS — F900 Attention-deficit hyperactivity disorder, predominantly inattentive type: Secondary | ICD-10-CM | POA: Diagnosis not present

## 2018-08-08 DIAGNOSIS — F331 Major depressive disorder, recurrent, moderate: Secondary | ICD-10-CM | POA: Diagnosis not present

## 2018-08-22 DIAGNOSIS — F331 Major depressive disorder, recurrent, moderate: Secondary | ICD-10-CM | POA: Diagnosis not present

## 2018-09-05 DIAGNOSIS — F331 Major depressive disorder, recurrent, moderate: Secondary | ICD-10-CM | POA: Diagnosis not present

## 2018-09-19 DIAGNOSIS — F331 Major depressive disorder, recurrent, moderate: Secondary | ICD-10-CM | POA: Diagnosis not present

## 2018-10-02 DIAGNOSIS — F331 Major depressive disorder, recurrent, moderate: Secondary | ICD-10-CM | POA: Diagnosis not present

## 2018-10-16 DIAGNOSIS — F331 Major depressive disorder, recurrent, moderate: Secondary | ICD-10-CM | POA: Diagnosis not present

## 2018-11-04 DIAGNOSIS — F331 Major depressive disorder, recurrent, moderate: Secondary | ICD-10-CM | POA: Diagnosis not present

## 2018-11-19 DIAGNOSIS — F331 Major depressive disorder, recurrent, moderate: Secondary | ICD-10-CM | POA: Diagnosis not present

## 2018-12-02 DIAGNOSIS — F3342 Major depressive disorder, recurrent, in full remission: Secondary | ICD-10-CM | POA: Diagnosis not present

## 2018-12-02 DIAGNOSIS — F900 Attention-deficit hyperactivity disorder, predominantly inattentive type: Secondary | ICD-10-CM | POA: Diagnosis not present

## 2018-12-02 DIAGNOSIS — F4321 Adjustment disorder with depressed mood: Secondary | ICD-10-CM | POA: Diagnosis not present

## 2018-12-09 DIAGNOSIS — F331 Major depressive disorder, recurrent, moderate: Secondary | ICD-10-CM | POA: Diagnosis not present

## 2018-12-23 DIAGNOSIS — F331 Major depressive disorder, recurrent, moderate: Secondary | ICD-10-CM | POA: Diagnosis not present

## 2019-01-08 DIAGNOSIS — F331 Major depressive disorder, recurrent, moderate: Secondary | ICD-10-CM | POA: Diagnosis not present

## 2019-01-22 DIAGNOSIS — F331 Major depressive disorder, recurrent, moderate: Secondary | ICD-10-CM | POA: Diagnosis not present

## 2019-01-30 ENCOUNTER — Other Ambulatory Visit (INDEPENDENT_AMBULATORY_CARE_PROVIDER_SITE_OTHER): Payer: BC Managed Care – PPO

## 2019-01-30 ENCOUNTER — Other Ambulatory Visit: Payer: Self-pay

## 2019-01-30 DIAGNOSIS — Z23 Encounter for immunization: Secondary | ICD-10-CM | POA: Diagnosis not present

## 2019-02-03 DIAGNOSIS — F331 Major depressive disorder, recurrent, moderate: Secondary | ICD-10-CM | POA: Diagnosis not present

## 2019-02-17 DIAGNOSIS — F331 Major depressive disorder, recurrent, moderate: Secondary | ICD-10-CM | POA: Diagnosis not present

## 2019-03-10 DIAGNOSIS — F331 Major depressive disorder, recurrent, moderate: Secondary | ICD-10-CM | POA: Diagnosis not present

## 2019-03-25 DIAGNOSIS — F331 Major depressive disorder, recurrent, moderate: Secondary | ICD-10-CM | POA: Diagnosis not present

## 2019-04-09 DIAGNOSIS — F331 Major depressive disorder, recurrent, moderate: Secondary | ICD-10-CM | POA: Diagnosis not present

## 2019-04-21 DIAGNOSIS — F331 Major depressive disorder, recurrent, moderate: Secondary | ICD-10-CM | POA: Diagnosis not present

## 2019-05-04 DIAGNOSIS — F331 Major depressive disorder, recurrent, moderate: Secondary | ICD-10-CM | POA: Diagnosis not present

## 2019-05-18 DIAGNOSIS — F331 Major depressive disorder, recurrent, moderate: Secondary | ICD-10-CM | POA: Diagnosis not present

## 2019-05-26 DIAGNOSIS — F900 Attention-deficit hyperactivity disorder, predominantly inattentive type: Secondary | ICD-10-CM | POA: Diagnosis not present

## 2019-05-26 DIAGNOSIS — F331 Major depressive disorder, recurrent, moderate: Secondary | ICD-10-CM | POA: Diagnosis not present

## 2019-06-11 DIAGNOSIS — F331 Major depressive disorder, recurrent, moderate: Secondary | ICD-10-CM | POA: Diagnosis not present

## 2023-07-09 ENCOUNTER — Other Ambulatory Visit (HOSPITAL_COMMUNITY): Payer: Self-pay

## 2023-07-09 ENCOUNTER — Other Ambulatory Visit: Payer: Self-pay

## 2023-07-09 ENCOUNTER — Ambulatory Visit: Payer: No Typology Code available for payment source | Admitting: Medical

## 2023-07-09 VITALS — BP 148/98 | HR 82 | Ht 73.0 in | Wt 356.0 lb

## 2023-07-09 DIAGNOSIS — R0609 Other forms of dyspnea: Secondary | ICD-10-CM | POA: Diagnosis not present

## 2023-07-09 DIAGNOSIS — I10 Essential (primary) hypertension: Secondary | ICD-10-CM | POA: Diagnosis not present

## 2023-07-09 DIAGNOSIS — R5383 Other fatigue: Secondary | ICD-10-CM

## 2023-07-09 DIAGNOSIS — R739 Hyperglycemia, unspecified: Secondary | ICD-10-CM

## 2023-07-09 MED ORDER — ZEPBOUND 2.5 MG/0.5ML ~~LOC~~ SOAJ
2.5000 mg | SUBCUTANEOUS | 0 refills | Status: DC
  Filled 2023-07-09 – 2023-07-11 (×2): qty 2, 28d supply, fill #0

## 2023-07-09 MED ORDER — VALSARTAN-HYDROCHLOROTHIAZIDE 160-12.5 MG PO TABS: 1.0000 | ORAL_TABLET | Freq: Every day | ORAL | 2 refills | Status: DC

## 2023-07-09 MED ORDER — ZEPBOUND 5 MG/0.5ML ~~LOC~~ SOAJ
5.0000 mg | SUBCUTANEOUS | 0 refills | Status: DC
  Filled 2023-07-09: qty 2, 28d supply, fill #0

## 2023-07-09 NOTE — Progress Notes (Signed)
 Subjective:  Phillip Ortega is a 63 y.o. male who presents for Chief Complaint  Patient presents with   Hypertension    Old patient to re-establish, here for some elevated bp readings per work screening. Reading was 195/129. Get out of breath when he walks, states he knows he is overweight. Falls asleep in the middle of the day.      Here for concerns for BP.  Last visit here 2019.  In recent months he has felt general fatigue, just does not feel great in general, he does get out of breath easily with exercise.  Not exercising in general.  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  He went for a health screening at work the other day and his blood pressure was 195/129 with no symptoms.  His blood sugar was elevated and cholesterol elevated as well.  He brought his form with him today.  He notes being on blood pressure medicine last in 2017.  He denies any chest pain, palpitations, edema.  No polyuria polydipsia.  Nonsmoker, occasional alcohol use.  No drugs  No other aggravating or relieving factors.    No other c/o.  Past Medical History:  Diagnosis Date   Allergy    rhinitis   Anxiety    Depression    Dislocation of left elbow 06/27/2015   GERD (gastroesophageal reflux disease)    History of kidney stones    Hypertension    Major depression 2013   Phillip Aurora MD   PONV (postoperative nausea and vomiting)    Nausea   Sleep apnea 2013   Phillip Aurora MD   Stroke [facility]) 06/2015   TIA (transient ischemic attack)    No current outpatient medications on file prior to visit.   No current facility-administered medications on file prior to visit.     The following portions of the patient's history were reviewed and updated as appropriate: allergies, current medications, past family history, past medical history, past social history, past surgical history and problem list.  ROS Otherwise as in subjective above    Objective: BP (!) 148/98   Pulse 82   Ht 6' 1  (1.854 m)   Wt (!) 356 lb (161.5 kg)   SpO2 94%   BMI 46.97 kg/m   Wt Readings from Last 3 Encounters:  07/09/23 (!) 356 lb (161.5 kg)  03/18/18 299 lb (135.6 kg)  12/23/17 299 lb 12.8 oz (136 kg)   BP Readings from Last 3 Encounters:  07/09/23 (!) 148/98  03/18/18 (!) 144/96  12/23/17 140/90    General appearance: alert, no distress, well developed, well nourished Neck: supple, no lymphadenopathy, no thyromegaly, no masses Heart: RRR, normal S1, S2, no murmurs Lungs: CTA bilaterally, no wheezes, rhonchi, or rales Pulses: 2+ radial pulses, 2+ pedal pulses, normal cap refill Ext: no edema  EKG reviewed  Assessment: Encounter Diagnoses  Name Primary?   Primary hypertension Yes   Morbid obesity (HCC)    DOE (dyspnea on exertion)    Fatigue, unspecified type    Hyperglycemia      Plan: We discussed symptoms and concerns and recent findings on biometric screening  Per his form that he brought in his blood pressure on July 03, 2023 was 195/129.  His BMI was 47.  His fasting glucose was 111.  His LDL cholesterol is 131, total cholesterol 181.  EKG reviewed.  No worrisome findings today  We discussed the reality is that he needs to get back  on medication, needs to get diet and exercise and check, needs to lose weight.  Counseled on diet and exercise today   Recommendations Begin valsartan  HCT blood pressure pill once daily in the morning Begin Zepbound  injectable weight loss medication 2.5 mg weekly for the first month.  After 1 month he will go up to the 5 mg dose Work on starting to get some exercise most days per week such as walking for at least 30 minutes a day Look at your diet and make some significant changes if possible Lets see you back in 1 month If you are having any worrisome symptoms such as chest pain, palpitations of the heart, chest pain worse with exercise, swelling of the legs then get checked out right away  Discussed proper use of the Zepbound   injectable medications  Of note he has history of sleep apnea but never tolerated CPAP due to claustrophobia.   [name] was seen today for hypertension.  Diagnoses and all orders for this visit:  Primary hypertension -     EKG 12-Lead  Morbid obesity (HCC)  DOE (dyspnea on exertion) -     EKG 12-Lead  Fatigue, unspecified type  Hyperglycemia  Other orders -     tirzepatide  (ZEPBOUND ) 2.5 MG/0.5ML Pen; Inject 2.5 mg into the skin once a week. -     tirzepatide  (ZEPBOUND ) 5 MG/0.5ML Pen; Inject 5 mg into the skin once a week. -     valsartan -hydrochlorothiazide  (DIOVAN -HCT) 160-12.5 MG tablet; Take 1 tablet by mouth daily.    Follow up: 10mo

## 2023-07-09 NOTE — Patient Instructions (Addendum)
 Recommendations Begin valsartan  HCT blood pressure pill once daily in the morning Begin Zepbound  injectable weight loss medication 2.5 mg weekly for the first month.  After 1 month he will go up to the 5 mg dose Work on starting to get some exercise most days per week such as walking for at least 30 minutes a day Look at your diet and make some significant changes if possible Lets see you back in 1 month If you are having any worrisome symptoms such as chest pain, palpitations of the heart, chest pain worse with exercise, swelling of the legs then get checked out right away   [facility] Address: [address] [place], [place], [place] [address] Phone: [phone]

## 2023-07-11 ENCOUNTER — Other Ambulatory Visit (HOSPITAL_COMMUNITY): Payer: Self-pay

## 2023-07-11 ENCOUNTER — Telehealth: Payer: Self-pay

## 2023-07-11 NOTE — Telephone Encounter (Signed)
 Pharmacy Patient Advocate Encounter   Received notification from CoverMyMeds that prior authorization for Zepbound  2.5MG /0.5ML pen-injectors  is required/requested.   Insurance verification completed.   The patient is insured through [facility] .   Per test claim:   Theres no Prior Authorization that needs to be done. However to get this medication the patients plan requires the pt to enroll with a company named ([employer] AT [phone])

## 2023-08-06 ENCOUNTER — Ambulatory Visit: Payer: No Typology Code available for payment source | Admitting: Medical

## 2023-08-06 ENCOUNTER — Encounter: Payer: Self-pay | Admitting: Medical

## 2023-08-06 VITALS — BP 108/80 | HR 80 | Ht 73.0 in | Wt 355.0 lb

## 2023-08-06 DIAGNOSIS — G4733 Obstructive sleep apnea (adult) (pediatric): Secondary | ICD-10-CM | POA: Insufficient documentation

## 2023-08-06 DIAGNOSIS — M25551 Pain in right hip: Secondary | ICD-10-CM | POA: Insufficient documentation

## 2023-08-06 DIAGNOSIS — I10 Essential (primary) hypertension: Secondary | ICD-10-CM | POA: Insufficient documentation

## 2023-08-06 DIAGNOSIS — M25552 Pain in left hip: Secondary | ICD-10-CM

## 2023-08-06 DIAGNOSIS — R7301 Impaired fasting glucose: Secondary | ICD-10-CM | POA: Insufficient documentation

## 2023-08-06 MED ORDER — VALSARTAN-HYDROCHLOROTHIAZIDE 80-12.5 MG PO TABS: 1.0000 | ORAL_TABLET | Freq: Every day | ORAL | 1 refills | Status: DC

## 2023-08-06 NOTE — Progress Notes (Signed)
 Subjective:  Phillip Ortega is a 63 y.o. male who presents for Chief Complaint  Patient presents with   Follow-up    1 month follow up. Insurance would not pay injectable weightloss drugs. Has been trying to exercise but it does hurt.      Here for recheck on blood pressure.  I saw him last visit for uncontrolled high blood pressure.  He has not been back to this office since 2019.  He did start valsartan HCT 160/12.5 mg last visit.  Doing fine, compliant with medicine.  Here to follow-up on this.  We also encouraged him to use weight loss medication Zepbound last visit but insurance had an issue with the coverage  He has started to make some diet and exercise changes since last visit  He notes that he went to do some walking for exercise recently and his hips hurt so bad he had to sit down and asked his wife to go get the car to pick him up.  Particular on the right.  No numbness or tingling or fall.  No other aggravating or relieving factors.    No other c/o.  Past Medical History:  Diagnosis Date   Allergy    rhinitis   Anxiety    Depression    Dislocation of left elbow 06/27/2015   GERD (gastroesophageal reflux disease)    History of kidney stones    Hypertension    Major depression 2013   Phillip Evener MD   PONV (postoperative nausea and vomiting)    Nausea   Sleep apnea 2013   Phillip Evener MD   Stroke [facility]) 06/2015   TIA (transient ischemic attack)    Current Outpatient Medications on File Prior to Visit  Medication Sig Dispense Refill   valsartan-hydrochlorothiazide (DIOVAN-HCT) 160-12.5 MG tablet Take 1 tablet by mouth daily. 30 tablet 2   tirzepatide (ZEPBOUND) 2.5 MG/0.5ML Pen Inject 2.5 mg into the skin once a week. (Patient not taking: Reported on 08/06/2023) 2 mL 0   tirzepatide (ZEPBOUND) 5 MG/0.5ML Pen Inject 5 mg into the skin once a week. (Patient not taking: Reported on 08/06/2023) 2 mL 0   No current facility-administered medications on file prior to visit.      The following portions of the patient's history were reviewed and updated as appropriate: allergies, current medications, past family history, past medical history, past social history, past surgical history and problem list.  ROS Otherwise as in subjective above    Objective: BP 108/80   Pulse 80   Ht 6\' 1"  (1.854 m)   Wt (!) 355 lb (161 kg)   BMI 46.84 kg/m   Wt Readings from Last 3 Encounters:  08/06/23 (!) 355 lb (161 kg)  07/09/23 (!) 356 lb (161.5 kg)  03/18/18 299 lb (135.6 kg)   BP Readings from Last 3 Encounters:  08/06/23 108/80  07/09/23 (!) 148/98  03/18/18 (!) 144/96    General appearance: alert, no distress, well developed, well nourished Neck: supple, no lymphadenopathy, no thyromegaly, no masses Heart: RRR, normal S1, S2, no murmurs Lungs: CTA bilaterally, no wheezes, rhonchi, or rales Pulses: 2+ radial pulses, 2+ pedal pulses, normal cap refill Ext: no edema    Assessment: Encounter Diagnoses  Name Primary?   Essential hypertension, benign Yes   Impaired fasting blood sugar    Bilateral hip pain    Obesity, Class III, BMI 40-49.9 (morbid obesity) (HCC)    OSA (obstructive sleep apnea)      Plan: Hypertension-after starting  back on medicine valsartan HCT last visit his blood pressure is actually on the low side today.  I will scale down valsartan HCT 160/12.5 mg to 80 mg / 12.5 mg.  Updated labs today as below  History of impaired glucose-updated labs as below  New complaint of bilateral hip pain-referral for x-rays  Obesity-unfortunately insurance would not cover Zepbound although there was some notation about a special department he needs to call.  I encouraged him to do this in case this medicine may be covered.  We discussed diet and exercise again.  He is working to improve on this  Of note he has history of sleep apnea but never tolerated CPAP due to claustrophobia.   Reco was seen today for follow-up.  Diagnoses and all  orders for this visit:  Essential hypertension, benign -     Comprehensive metabolic panel  Impaired fasting blood sugar -     Hemoglobin A1c -     Comprehensive metabolic panel  Bilateral hip pain -     DG HIPS BILAT WITH PELVIS 3-4 VIEWS; Future  Obesity, Class III, BMI 40-49.9 (morbid obesity) (HCC)  OSA (obstructive sleep apnea)  Other orders -     valsartan-hydrochlorothiazide (DIOVAN-HCT) 80-12.5 MG tablet; Take 1 tablet by mouth daily.    Follow up: pending labs, xray

## 2023-08-06 NOTE — Patient Instructions (Signed)
 Please go to [facility] for your hips xray.   Their hours are 8am - 4:30 pm Monday - Friday.  Take your insurance card with you.  [facility] [phone]   [address] [place], [place] [address]

## 2023-08-07 ENCOUNTER — Other Ambulatory Visit: Payer: Self-pay | Admitting: Medical

## 2023-08-07 ENCOUNTER — Telehealth: Payer: Self-pay

## 2023-08-07 ENCOUNTER — Other Ambulatory Visit (HOSPITAL_COMMUNITY): Payer: Self-pay

## 2023-08-07 LAB — SPECIMEN STATUS REPORT

## 2023-08-07 LAB — COMPREHENSIVE METABOLIC PANEL
ALT: 23 IU/L (ref 0–44)
AST: 21 IU/L (ref 0–40)
Albumin: 3.9 g/dL (ref 3.9–4.9)
Alkaline Phosphatase: 55 IU/L (ref 44–121)
BUN/Creatinine Ratio: 18 (ref 10–24)
BUN: 21 mg/dL (ref 8–27)
Bilirubin Total: 0.5 mg/dL (ref 0.0–1.2)
CO2: 19 mmol/L — ABNORMAL LOW (ref 20–29)
Calcium: 9.4 mg/dL (ref 8.6–10.2)
Chloride: 102 mmol/L (ref 96–106)
Creatinine, Ser: 1.19 mg/dL (ref 0.76–1.27)
Globulin, Total: 3.6 g/dL (ref 1.5–4.5)
Glucose: 124 mg/dL — ABNORMAL HIGH (ref 70–99)
Potassium: 4.3 mmol/L (ref 3.5–5.2)
Sodium: 138 mmol/L (ref 134–144)
Total Protein: 7.5 g/dL (ref 6.0–8.5)
eGFR: 69 mL/min/{1.73_m2} (ref >59)

## 2023-08-07 LAB — HEMOGLOBIN A1C
Est. average glucose Bld gHb Est-mCnc: 148 mg/dL
Hgb A1c MFr Bld: 6.8 % — ABNORMAL HIGH (ref 4.8–5.6)

## 2023-08-07 MED ORDER — MOUNJARO 2.5 MG/0.5ML ~~LOC~~ SOAJ: 2.5000 mg | SUBCUTANEOUS | 0 refills | Status: DC

## 2023-08-07 MED ORDER — MOUNJARO 5 MG/0.5ML ~~LOC~~ SOAJ: 5.0000 mg | SUBCUTANEOUS | 1 refills | Status: DC

## 2023-08-07 NOTE — Telephone Encounter (Signed)
 Pharmacy Patient Advocate Encounter   Received notification from CoverMyMeds that prior authorization for Mounjaro 2.5MG /0.5ML auto-injectors is required/requested.   Insurance verification completed.   The patient is insured through [facility] .   Per test claim: PA required; PA submitted to above mentioned insurance via CoverMyMeds Key/confirmation #/EOC (Key: [id number])    Status is pending

## 2023-08-07 NOTE — Progress Notes (Signed)
 Kidney and electrolytes okay.  Unfortunately your labs do show diabetes.  Your hemoglobin A1c diabetes marker 6.8%, blood sugars elevated.  Since your A1c is over 6.8 I am going to resend 1 of those weekly injectables called Mounjaro.  We have sufficient reason for you to start this now to help with both blood sugars and weight loss.

## 2023-08-08 ENCOUNTER — Other Ambulatory Visit (HOSPITAL_COMMUNITY): Payer: Self-pay

## 2023-08-08 NOTE — Telephone Encounter (Signed)
 Pharmacy Patient Advocate Encounter  Received notification from [facility] that Prior Authorization for [facility] 2.5MG /0.5ML auto-injectors has been APPROVED from 3.5.25 to 3.4.28. Ran test claim, Copay is $159.06. This test claim was processed through [facility]- copay amounts may vary at other pharmacies due to pharmacy/plan contracts, or as the patient moves through the different stages of their insurance plan.   PA #/Case ID/Reference #: (Key: [id number])

## 2023-08-28 ENCOUNTER — Other Ambulatory Visit: Payer: Self-pay | Admitting: Medical

## 2023-09-26 LAB — HM DIABETES EYE EXAM

## 2023-10-01 ENCOUNTER — Ambulatory Visit: Payer: Self-pay

## 2023-10-01 NOTE — Telephone Encounter (Signed)
  Chief Complaint: medication question Symptoms: mild nausea, belching Frequency: N/A. Pertinent Negatives: Patient denies N/a. Disposition: [] ED /[] Urgent Care (no appt availability in office) / [] Appointment(In office/virtual)/ []  [facility]/ [] Home Care/ [] Refused Recommended Disposition /[] [place] [facility]/ [x]  Follow-up with PCP Additional Notes: Last Mounjaro  injection was on Friday 09/27/23, patient states he does not have any refills. Patient states he has lost about 25 pounds over the past month or 2. He states he did not address with his PCP how [place] he would continue on the medication or when he needs to be seen for a follow up. The patient is willing to continue on the medication, he states side effects are minimal but he just wanted to know what the plan was in terms of how [place] he would be on this medication.  Copied from CRM [id number]. Topic: Clinical - Medication Question >> Oct 01, 2023 10:26 AM Alyse July wrote: Reason for CRM: Patient would like a call back at [phone] pertaining to whether he should continue taking tirzepatide  (MOUNJARO ).  Reason for Disposition  [1] Pharmacy calling with prescription question AND [2] triager unable to answer question  Answer Assessment - Initial Assessment Questions 1. NAME of MEDICINE: "What medicine(s) are you calling about?"     Mounjaro   2. QUESTION: "What is your question?" (e.g., double dose of medicine, side effect)     Patient states there are no refills on his mounjaro , he has taken it for 8 weeks and he is wondering if he will continue taking the medication.  3. PRESCRIBER: "Who prescribed the medicine?" Reason: if prescribed by specialist, call should be referred to that group.     PA Tysinger.  4. SYMPTOMS: "Do you have any symptoms?" If Yes, ask: "What symptoms are you having?"  "How bad are the symptoms (e.g., mild, moderate, severe)     "Sometimes a little bit of nausea", belching with bad after taste.  He states it is not debilitating.  5. PREGNANCY:  "Is there any chance that you are pregnant?" "When was your last menstrual period?"     N/A.  Protocols used: Medication Question Call-A-AH

## 2023-10-01 NOTE — Telephone Encounter (Signed)
 Copied from CRM [id number]. Topic: Clinical - Medication Question >> Oct 01, 2023 10:26 AM Alyse July wrote: Reason for CRM: Patient would like a call back at [phone] pertaining to whether he should continue taking tirzepatide  (MOUNJARO ).   Aaron Aas

## 2023-10-03 ENCOUNTER — Other Ambulatory Visit: Payer: Self-pay | Admitting: Medical

## 2023-10-03 MED ORDER — TIRZEPATIDE 7.5 MG/0.5ML ~~LOC~~ SOAJ: 7.5000 mg | SUBCUTANEOUS | 0 refills | Status: DC

## 2023-10-03 NOTE — Telephone Encounter (Signed)
 Last visit Phillip Ortega lower BP medication from 160 to 80mg 

## 2023-10-03 NOTE — Telephone Encounter (Signed)
 Pt was notified.

## 2023-11-11 ENCOUNTER — Ambulatory Visit: Admitting: Medical

## 2023-11-11 ENCOUNTER — Encounter: Payer: Self-pay | Admitting: Medical

## 2023-11-11 VITALS — BP 110/82 | HR 75 | Ht 73.2 in | Wt 320.2 lb

## 2023-11-11 DIAGNOSIS — M25552 Pain in left hip: Secondary | ICD-10-CM

## 2023-11-11 DIAGNOSIS — F419 Anxiety disorder, unspecified: Secondary | ICD-10-CM | POA: Diagnosis not present

## 2023-11-11 DIAGNOSIS — I10 Essential (primary) hypertension: Secondary | ICD-10-CM | POA: Diagnosis not present

## 2023-11-11 DIAGNOSIS — E66813 Obesity, class 3: Secondary | ICD-10-CM

## 2023-11-11 DIAGNOSIS — M25551 Pain in right hip: Secondary | ICD-10-CM

## 2023-11-11 DIAGNOSIS — E1165 Type 2 diabetes mellitus with hyperglycemia: Secondary | ICD-10-CM | POA: Diagnosis not present

## 2023-11-11 NOTE — Patient Instructions (Signed)
 Hypertension-continue valsartan  HCT 80/12.5 mg daily.  If your blood pressure start dropping below 108/65 or less then we will have to cut your blood pressure down some more   Diabetes-continue Mounjaro  7.5 mg weekly. Check your insurance to see if other options would be cheaper such as Rybelsus, Ozempic, or Trulicity The pharmaceutical study is not available at this time that we discussed Continue efforts to lose more weight through healthy eating habits and regular exercise   Bilateral hip pain Please go to [facility] for your hip xray.   Their hours are 8am - 4:30 pm Monday - Friday.  Take your insurance card with you.  [facility] [phone]   [address] [place], [place] [address]   Obesity-continue Mounjaro , continue efforts to lose weight through healthy eating habits and exercise   Of note he has history of sleep apnea but never tolerated CPAP due to claustrophobia.

## 2023-11-11 NOTE — Progress Notes (Signed)
 Subjective:  Phillip Ortega is a 63 y.o. male who presents for Chief Complaint  Patient presents with   Follow-up    Follow up on A1c and wt. And blood pressure.      Here for med check  Hypertension - compliant with BP medication Valsartan  HCT 80/12.5mg  daily.  Last visit we reduced dose given his weight loss.  No current issues  Diabetes - new diagnosis earlier in the year.  He has lost about 36 pounds on the Mounjaro  so far.  He is on Mounjaro  7.5 mg weekly.  He is doing relatively well except for some belching.  He is seeing success but the medication is quite expensive  No blurry vision, no polyuria or polydipsia.  He is does not want to have to be on this medicine forever.  He would like to see his weight get at least under 300 pounds  He saw his eye doctor, [facility] few months ago  He has declined colon cancer screening up to now.  He does not like the idea of a colonoscopy.  He is still not sure if he wants to move forward with a colon cancer screening  His exercise is limited by hip pains.  No other aggravating or relieving factors.    No other c/o.  Past Medical History:  Diagnosis Date   Allergy    rhinitis   Anxiety    Depression    Dislocation of left elbow 06/27/2015   GERD (gastroesophageal reflux disease)    History of kidney stones    Hypertension    Major depression 2013   Daphine Eagle MD   PONV (postoperative nausea and vomiting)    Nausea   Sleep apnea 2013   Daphine Eagle MD   Stroke [facility]) 06/2015   TIA (transient ischemic attack)    Current Outpatient Medications on File Prior to Visit  Medication Sig Dispense Refill   tirzepatide  (MOUNJARO ) 7.5 MG/0.5ML Pen Inject 7.5 mg into the skin once a week. 6 mL 0   valsartan -hydrochlorothiazide  (DIOVAN -HCT) 80-12.5 MG tablet TAKE 1 TABLET BY MOUTH EVERY DAY 90 tablet 1   No current facility-administered medications on file prior to visit.    The following portions of the patient's history were  reviewed and updated as appropriate: allergies, current medications, past family history, past medical history, past social history, past surgical history and problem list.  ROS Otherwise as in subjective above     Objective: BP 110/82   Pulse 75   Ht 6' 1.2" (1.859 m)   Wt (!) 320 lb 3.2 oz (145.2 kg)   SpO2 95%   BMI 42.01 kg/m   Wt Readings from Last 3 Encounters:  11/11/23 (!) 320 lb 3.2 oz (145.2 kg)  08/06/23 (!) 355 lb (161 kg)  07/09/23 (!) 356 lb (161.5 kg)   BP Readings from Last 3 Encounters:  11/11/23 110/82  08/06/23 108/80  07/09/23 (!) 148/98   General appearance: alert, no distress, well developed, well nourished Neck: supple, no lymphadenopathy, no thyromegaly, no masses Heart: RRR, normal S1, S2, no murmurs Lungs: CTA bilaterally, no wheezes, rhonchi, or rales Pulses: 2+ radial pulses, 2+ pedal pulses, normal cap refill Ext: no edema    Assessment: Encounter Diagnoses  Name Primary?   Type 2 diabetes mellitus with hyperglycemia, without Heming-term current use of insulin (HCC) Yes   Primary hypertension    Essential hypertension, benign    Anxiety    Obesity, Class III, BMI 40-49.9 (morbid obesity)  Bilateral hip pain       Plan: Hypertension-continue valsartan  HCT 80/12.5 mg daily.  If your blood pressure start dropping below 108/65 or less then we will have to cut your blood pressure down some more   Diabetes-continue Mounjaro  7.5 mg weekly. Check your insurance to see if other options would be cheaper such as Rybelsus, Ozempic, or Trulicity The pharmaceutical study is not available at this time that we discussed Continue efforts to lose more weight through healthy eating habits and regular exercise   Bilateral hip pain Please go to [facility] for your hip xray.   Their hours are 8am - 4:30 pm Monday - Friday.  Take your insurance card with you.  [facility] [phone]   [address] [place], [place]  [address]   Obesity-continue Mounjaro , continue efforts to lose weight through healthy eating habits and exercise   Of note he has history of sleep apnea but never tolerated CPAP due to claustrophobia.   Chrsitopher was seen today for follow-up.  Diagnoses and all orders for this visit:  Type 2 diabetes mellitus with hyperglycemia, without Gumbs-term current use of insulin (HCC)  Primary hypertension  Essential hypertension, benign  Anxiety  Obesity, Class III, BMI 40-49.9 (morbid obesity)  Bilateral hip pain -     DG HIP UNILAT W OR W/O PELVIS 2-3 VIEWS LEFT -     DG HIP UNILAT W OR W/O PELVIS 2-3 VIEWS RIGHT     Follow up: 3 months for a fasting physical

## 2023-12-27 ENCOUNTER — Other Ambulatory Visit: Payer: Self-pay | Admitting: Medical

## 2023-12-27 ENCOUNTER — Telehealth: Payer: Self-pay | Admitting: Medical

## 2023-12-27 MED ORDER — MOUNJARO 12.5 MG/0.5ML ~~LOC~~ SOAJ: 12.5000 mg | SUBCUTANEOUS | 0 refills | Status: DC

## 2023-12-27 MED ORDER — MOUNJARO 10 MG/0.5ML ~~LOC~~ SOAJ: 10.0000 mg | SUBCUTANEOUS | 0 refills | Status: DC

## 2023-12-27 NOTE — Telephone Encounter (Signed)
 Copied from CRM [id number]. Topic: Clinical - Medication Refill >> Dec 27, 2023  9:51 AM Charlet HERO wrote: Medication: tirzepatide  (MOUNJARO ) 7.5 MG/0.5ML Pen  Has the patient contacted their pharmacy? Yes Needs to call in  This is the patient's preferred pharmacy:  [id number] [place], [place] - [address] [place] [address] Phone: [phone] Fax: [phone]    Is this the correct pharmacy for this prescription? Yes If no, delete pharmacy and type the correct one.   Has the prescription been filled recently? Yes  Is the patient out of the medication? Yes  Has the patient been seen for an appointment in the last year OR does the patient have an upcoming appointment? Yes  Can we respond through MyChart? No  Agent: Please be advised that Rx refills may take up to 3 business days. We ask that you follow-up with your pharmacy.

## 2023-12-27 NOTE — Telephone Encounter (Signed)
 I sent the next 2 higher doses of your Mounjaro  to the pharmacy.  Going up to the 10 mg for a month then go to the 12.5 mg

## 2024-01-23 ENCOUNTER — Other Ambulatory Visit: Payer: Self-pay | Admitting: Medical

## 2024-01-23 MED ORDER — MOUNJARO 12.5 MG/0.5ML ~~LOC~~ SOAJ: 12.5000 mg | SUBCUTANEOUS | 1 refills | Status: DC

## 2024-01-23 NOTE — Telephone Encounter (Signed)
 Copied from CRM [id number]. Topic: Clinical - Medication Refill >> Jan 23, 2024 12:08 PM Kevelyn M wrote: Medication: tirzepatide  (MOUNJARO ) 12.5 MG/0.5ML Pen  Has the patient contacted their pharmacy? Yes (Agent: If no, request that the patient contact the pharmacy for the refill. If patient does not wish to contact the pharmacy document the reason why and proceed with request.) (Agent: If yes, when and what did the pharmacy advise?)  This is the patient's preferred pharmacy:  [id number] [place], [place] - [address] [place] [address] Phone: [phone] Fax: [phone]  Is this the correct pharmacy for this prescription? Yes If no, delete pharmacy and type the correct one.   Has the prescription been filled recently? No  Is the patient out of the medication? Yes  Has the patient been seen for an appointment in the last year OR does the patient have an upcoming appointment? Yes  Can we respond through MyChart? Yes  Agent: Please be advised that Rx refills may take up to 3 business days. We ask that you follow-up with your pharmacy.

## 2024-02-20 ENCOUNTER — Other Ambulatory Visit: Payer: Self-pay | Admitting: Medical

## 2024-02-20 MED ORDER — MOUNJARO 12.5 MG/0.5ML ~~LOC~~ SOAJ: 12.5000 mg | SUBCUTANEOUS | 1 refills | Status: DC

## 2024-02-20 NOTE — Telephone Encounter (Signed)
 Copied from CRM [id number]. Topic: Clinical - Medication Refill >> Feb 20, 2024  9:20 AM Roselie BROCKS wrote: Medication: tirzepatide  (MOUNJARO ) 12.5 MG/0.5ML Pen  Has the patient contacted their pharmacy? Yes (Agent: If no, request that the patient contact the pharmacy for the refill. If patient does not wish to contact the pharmacy document the reason why and proceed with request.) (Agent: If yes, when and what did the pharmacy advise?)  This is the patient's preferred pharmacy:  [id number] [place], [place] - [address] [place] [address] Phone: [phone] Fax: [phone]    Is this the correct pharmacy for this prescription? Yes If no, delete pharmacy and type the correct one.   Has the prescription been filled recently? Yes  Is the patient out of the medication? Yes  Has the patient been seen for an appointment in the last year OR does the patient have an upcoming appointment? Yes  Can we respond through MyChart? Yes  Agent: Please be advised that Rx refills may take up to 3 business days. We ask that you follow-up with your pharmacy.

## 2024-02-20 NOTE — Telephone Encounter (Signed)
Patient scheduled in October

## 2024-03-03 ENCOUNTER — Other Ambulatory Visit: Payer: Self-pay | Admitting: Medical

## 2024-03-12 ENCOUNTER — Ambulatory Visit: Admitting: Medical

## 2024-03-12 VITALS — BP 110/68 | HR 81 | Ht 74.0 in | Wt 299.8 lb

## 2024-03-12 DIAGNOSIS — Z0000 Encounter for general adult medical examination without abnormal findings: Secondary | ICD-10-CM | POA: Diagnosis not present

## 2024-03-12 DIAGNOSIS — R0989 Other specified symptoms and signs involving the circulatory and respiratory systems: Secondary | ICD-10-CM

## 2024-03-12 DIAGNOSIS — L602 Onychogryphosis: Secondary | ICD-10-CM | POA: Insufficient documentation

## 2024-03-12 DIAGNOSIS — Z1211 Encounter for screening for malignant neoplasm of colon: Secondary | ICD-10-CM

## 2024-03-12 DIAGNOSIS — Z125 Encounter for screening for malignant neoplasm of prostate: Secondary | ICD-10-CM

## 2024-03-12 DIAGNOSIS — Z1389 Encounter for screening for other disorder: Secondary | ICD-10-CM

## 2024-03-12 DIAGNOSIS — I10 Essential (primary) hypertension: Secondary | ICD-10-CM

## 2024-03-12 DIAGNOSIS — E1165 Type 2 diabetes mellitus with hyperglycemia: Secondary | ICD-10-CM

## 2024-03-12 DIAGNOSIS — Z7185 Encounter for immunization safety counseling: Secondary | ICD-10-CM

## 2024-03-12 DIAGNOSIS — F411 Generalized anxiety disorder: Secondary | ICD-10-CM

## 2024-03-12 DIAGNOSIS — Z23 Encounter for immunization: Secondary | ICD-10-CM | POA: Diagnosis not present

## 2024-03-12 DIAGNOSIS — Z13220 Encounter for screening for lipoid disorders: Secondary | ICD-10-CM

## 2024-03-12 NOTE — Progress Notes (Signed)
 Name: Phillip Ortega   Date of Visit: 03/12/24   Date of last visit with me: 03/03/2024   CHIEF COMPLAINT:  Chief Complaint  Patient presents with   Annual Exam    Fasting cpe, on Mounjaro - and neck is sore and feels sometimes a knot.  Flu and covid shot, eye exam- [facility] [place](requested this)        HPI:  Discussed the use of AI scribe software for clinical note transcription with the patient, who gave verbal consent to proceed.  History of Present Illness   Phillip Ortega is a 63 year old male who presents for a well visit.  Patient Care Team: Sheldon Amara, Alm GORMAN RIGGERS as PCP - General (Family Medicine) Dentist Eye doctor   He is currently managing type 2 diabetes with Mounjaro  12.5 mg and hypertension with valsartan  HCT 80/12.5 mg. He is concerned about the cost of Mounjaro  and is exploring options for discounts. His last A1c was 6.8% seven months ago. He experiences constipation and nausea, which he attributes to his medication. He engages in daily walking for exercise but experiences hip pain after ten minutes.  He is due for a colon cancer screening and prefers a colonoscopy over Cologuard due to concerns about false positives. He has had two colonoscopies in the past and is familiar with the preparation process. No history of blood in the stool. His mother has a history of abnormal colonoscopy findings requiring annual screenings.  He reports intermittent swelling in his neck, which he sometimes feels more than others.  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 No chest pain or difficulty breathing.      Reviewed their medical, surgical, family, social, medication, and allergy history and updated chart as appropriate.  No Known Allergies  Past Medical History:  Diagnosis Date   Allergy    rhinitis   Anxiety    Depression    Dislocation of left elbow 06/27/2015   GERD (gastroesophageal reflux disease)    History of kidney stones     Hypertension    Major depression 2013   [name]   PONV (postoperative nausea and vomiting)    Nausea   Sleep apnea 2013   [name]   Stroke [facility]) 06/2015   TIA (transient ischemic attack)      Current Outpatient Medications:    tirzepatide  (MOUNJARO ) 12.5 MG/0.5ML Pen, Inject 12.5 mg into the skin once a week., Disp: 2 mL, Rfl: 1   valsartan -hydrochlorothiazide  (DIOVAN -HCT) 80-12.5 MG tablet, TAKE 1 TABLET BY MOUTH EVERY DAY, Disp: 90 tablet, Rfl: 0  Family History  Problem Relation Age of Onset   Migraines Mother    Dementia Mother    Anemia Sister     Past Surgical History:  Procedure Laterality Date   BONE EXCISION Left 01/20/2016   Procedure: Radical EXCISION HETEROTOPIC OSSIFICATION LEFT ELBOW;  Surgeon: [name], MD;  Location: [facility] OR;  Service: Orthopedics;  Laterality: Left;   LAPAROSCOPIC CHOLECYSTECTOMY  07   Tsuri   ORIF ELBOW FRACTURE Left 06/25/2015   Procedure: OPEN REDUCTION INTERNAL FIXATION (ORIF) ELBOW/OLECRANON FRACTURE RADIO HEAD ARTHROPLASTY;  Surgeon: [name], MD;  Location: [facility] OR;  Service: Orthopedics;  Laterality: Left;   TEE WITHOUT CARDIOVERSION N/A 11/29/2015   Procedure: TRANSESOPHAGEAL ECHOCARDIOGRAM (TEE);  Surgeon: [name], MD;  Location: [facility] ENDOSCOPY;  Service: Cardiovascular;  Laterality: N/A;     Review of Systems  Constitutional:  Negative for chills, fever,  malaise/fatigue and weight loss.  HENT:  Negative for congestion, ear pain, hearing loss, sore throat and tinnitus.   Eyes:  Negative for blurred vision, pain and redness.  Respiratory:  Negative for cough, hemoptysis and shortness of breath.   Cardiovascular:  Negative for chest pain, palpitations, orthopnea, claudication and leg swelling.  Gastrointestinal:  Negative for abdominal pain, blood in stool, constipation, diarrhea, nausea and vomiting.  Genitourinary:  Negative for dysuria, flank pain, frequency, hematuria and urgency.  Musculoskeletal:   Negative for falls, joint pain and myalgias.  Skin:  Negative for itching and rash.  Neurological:  Negative for dizziness, tingling, speech change, weakness and headaches.  Endo/Heme/Allergies:  Negative for polydipsia. Does not bruise/bleed easily.  Psychiatric/Behavioral:  Negative for depression and memory loss. The patient is not nervous/anxious and does not have insomnia.      OBJECTIVE:    BP 110/68   Pulse 81   Ht 6' 2 (1.88 m)   Wt 299 lb 12.8 oz (136 kg)   BMI 38.49 kg/m   BP Readings from Last 3 Encounters:  03/12/24 110/68  11/11/23 110/82  08/06/23 108/80    Wt Readings from Last 3 Encounters:  03/12/24 299 lb 12.8 oz (136 kg)  11/11/23 (!) 320 lb 3.2 oz (145.2 kg)  08/06/23 (!) 355 lb (161 kg)    Physical Exam   General appearance: alert, no distress, WD/WN, Caucasian male Skin: Numerous fleshy skin tags along his neck circumferentially, beside his on the left and left face, scattered macules throughout the body and torso, no other specific worrisome lesion otherwise HEENT: normocephalic, conjunctiva/corneas normal, sclerae anicteric, PERRLA, EOMi, nares patent, no discharge or erythema, pharynx normal Oral cavity: MMM, tongue normal Neck: supple, no lymphadenopathy, no thyromegaly, no masses, normal ROM, no bruits Chest: non tender, normal shape and expansion Heart: RRR, normal S1, S2, no murmurs Lungs: CTA bilaterally, no wheezes, rhonchi, or rales Abdomen: +bs, soft, non tender, non distended, no masses, no hepatomegaly, no splenomegaly, no bruits Back: non tender, normal ROM, no scoliosis Musculoskeletal: upper extremities non tender, no obvious deformity, normal ROM throughout, lower extremities non tender, no obvious deformity, normal ROM throughout Extremities: no edema, no cyanosis, no clubbing Pulses: 2+ symmetric, upper and lower extremities, normal cap refill Neurological: alert, oriented x 3, CN2-12 intact, strength normal upper extremities and  lower extremities, sensation normal throughout, DTRs 2+ throughout, no cerebellar signs, gait normal Psychiatric: normal affect, behavior normal, pleasant  GU: declined Rectal: declined  Diabetic Foot Exam - Simple   Simple Foot Form Diabetic Foot exam was performed with the following findings: Yes 03/12/2024 11:37 AM  Visual Inspection See comments: Yes Sensation Testing Intact to touch and monofilament testing bilaterally: Yes Pulse Check See comments: Yes Comments 1+ pedal pulses Flatfeet Hypertrophic toenails particularly the right toenail       ASSESSMENT/PLAN:   Encounter Diagnoses  Name Primary?   Encounter for health maintenance examination in adult Yes   Type 2 diabetes mellitus with hyperglycemia, without Pasillas-term current use of insulin (HCC)    COVID-19 vaccine administered    Needs flu shot    Essential hypertension, benign    Generalized anxiety disorder    Morbid obesity (HCC)    Encounter for lipid screening for cardiovascular disease    Vaccine counseling    Screening for colon cancer    Screening for prostate cancer    Screening for hematuria or proteinuria    Decreased pedal pulses    Hypertrophic toenail  Separate significant issues discussed: Diabetes type 2 -increase to Mounjaro  15 mg weekly.  I will send Ozempic in case 1 is cheaper than the other as he is having some issue with pricing - I recommend glucose monitoring - I recommend daily foot check - I recommend yearly eye doctor follow-up and diabetic eye exam  Hypertension - Continue valsartan  HCT 80/12.5 mg daily  Obesity -Congratulated him on his continued efforts to lose weight with success  Screening today for lipids.  Discussed standard of care for diabetes to institute statin  Referral today to dermatology for surveillance and consult  Referral today to podiatry for diabetic nail care and hypertrophic nails  Referral today for ABIs for blood flow screening in the  legs      This visit was a preventative care visit, also known as wellness visit or routine physical.   Topics typically include healthy lifestyle, diet, exercise, preventative care, vaccinations, sick and well care, proper use of emergency dept and after hours care, as well as other concerns.      General Recommendations: Continue to return yearly for your annual wellness and preventative care visits.  This gives us  a chance to discuss healthy lifestyle, exercise, vaccinations, review your chart record, and perform screenings where appropriate.  I recommend you see your eye doctor yearly for routine vision care.  I recommend you see your dentist yearly for routine dental care including hygiene visits twice yearly.   Vaccination  Immunization History  Administered Date(s) Administered   Influenza, Quadrivalent, Recombinant, Inj, Pf 04/23/2020   Influenza, Seasonal, Injecte, Preservative Fre 03/12/2024   Influenza,inj,Quad PF,6+ Mos 03/08/2016, 01/30/2019   Influenza-Unspecified 02/17/2022, 02/22/2023   PFIZER(Purple Top)SARS-COV-2 Vaccination 08/23/2019, 09/13/2019, 03/18/2020, 09/23/2020, 03/10/2021   Pfizer(Comirnaty)Fall Seasonal Vaccine 12 years and older 02/17/2022, 02/22/2023, 03/12/2024   Tdap 06/19/2015    Vaccine recommendations: Shingrix, flu, covid, prevnar 20  Vaccines administered today: Flu and covid vaccines administered today   Screening for cancer: Colon cancer screening: We will refer you for screening colonoscopy  Prostate Cancer screening: The recommended prostate cancer screening test is a blood test called the prostate-specific antigen (PSA) test. PSA is a protein that is made in the prostate. As you age, your prostate naturally produces more PSA. Abnormally high PSA levels may be caused by: Prostate cancer. An enlarged prostate that is not caused by cancer (benign prostatic hyperplasia, or BPH). This condition is very common in older men. A prostate  gland infection (prostatitis) or urinary tract infection. Certain medicines such as male hormones (like testosterone) or other medicines that raise testosterone levels. A rectal exam may be done as part of prostate cancer screening to help provide information about the size of your prostate gland. When a rectal exam is performed, it should be done after the PSA level is drawn to avoid any effect on the results.   Skin cancer screening: Check your skin regularly for new changes, growing lesions, or other lesions of concern Come in for evaluation if you have skin lesions of concern.   Lung cancer screening: If you have a greater than 20 pack year history of tobacco use, then you may qualify for lung cancer screening with a chest CT scan.   Please call your insurance company to inquire about coverage for this test.   Pancreatic cancer:  no current screening test is available or routinely recommended. (risk factors: smoking, overweight or obese, diabetes, chronic pancreatitis, work exposure - dry cleaning, metal working, 63yo>, M>F, African American, family hx/o, hereditary  breast, ovarian, melanoma, lynch, peutz-jeghers).  Symptoms: jaundice, dark urine, light color or greasy stools, itchy skin, belly or back pain, weight loss, poor appetite, nause, vomiting, liver enlargement, DVT/blood clots.   We currently don't have screenings for other cancers besides breast, cervical, colon, and lung cancers.  If you have a strong family history of cancer or have other cancer screening concerns, please let me know.  Genetic testing referral is an option for individuals with high cancer risk in the family.  There are some other cancer screenings in development currently.   Bone health: Get at least 150 minutes of aerobic exercise weekly Get weight bearing exercise at least once weekly Bone density test:  A bone density test is an imaging test that uses a type of X-ray to measure the amount of calcium  and  other minerals in your bones. The test may be used to diagnose or screen you for a condition that causes weak or thin bones (osteoporosis), predict your risk for a broken bone (fracture), or determine how well your osteoporosis treatment is working. The bone density test is recommended for females 65 and older, or females or males <65 if certain risk factors such as thyroid disease, [place] term use of steroids such as for asthma or rheumatological issues, vitamin D deficiency, estrogen deficiency, family history of osteoporosis, self or family history of fragility fracture in first degree relative.    Heart health: Get at least 150 minutes of aerobic exercise weekly Limit alcohol It is important to maintain a healthy blood pressure and healthy cholesterol numbers  Heart disease screening: Screening for heart disease includes screening for blood pressure, fasting lipids, glucose/diabetes screening, BMI height to weight ratio, reviewed of smoking status, physical activity, and diet.    Goals include blood pressure 120/80 or less, maintaining a healthy lipid/cholesterol profile, preventing diabetes or keeping diabetes numbers under good control, not smoking or using tobacco products, exercising most days per week or at least 150 minutes per week of exercise, and eating healthy variety of fruits and vegetables, healthy oils, and avoiding unhealthy food choices like fried food, fast food, high sugar and high cholesterol foods.    Other tests may possibly include EKG test, CT coronary calcium  score, echocardiogram, exercise treadmill stress test.     Reviewed  07/2023 EKG and echo from 2017   Vascular disease screening: For higher risk individuals including smokers, diabetics, patients with known heart disease or high blood pressure, kidney disease, and others, screening for vascular disease or atherosclerosis of the arteries is available.  Examples may include carotid ultrasound, abdominal aortic  ultrasound, ABI blood flow screening in the legs, thoracic aorta screening.  Referral for ABI   Medical care options: I recommend you continue to seek care here first for routine care.  We try really hard to have available appointments Monday through Friday daytime hours for sick visits, acute visits, and physicals.  Urgent care should be used for after hours and weekends for significant issues that cannot wait till the next day.  The emergency department should be used for significant potentially life-threatening emergencies.  The emergency department is expensive, can often have Rice wait times for less significant concerns, so try to utilize primary care, urgent care, or telemedicine when possible to avoid unnecessary trips to the emergency department.  Virtual visits and telemedicine have been introduced since the pandemic started in 2020, and can be convenient ways to receive medical care.  We offer virtual appointments as well to assist you in  a variety of options to seek medical care.   Legal  Take the time to do a last will and testament, Advanced Directives including Health Care Power of Attorney and Living Will documents.  Don't leave your family with burdens that can be handled ahead of time.   Advanced Directives: I recommend you consider completing a Health Care Power of Attorney and Living Will.   These documents respect your wishes and help alleviate burdens on your loved ones if you were to become terminally ill or be in a position to need those documents enforced.    You can complete Advanced Directives yourself, have them notarized, then have copies made for our office, for you and for anybody you feel should have them in safe keeping.  Or, you can have an attorney prepare these documents.   If you haven't updated your Last Will and Testament in a while, it may be worthwhile having an attorney prepare these documents together and save on some costs.       Zachary was seen today  for annual exam.  Diagnoses and all orders for this visit:  Encounter for health maintenance examination in adult -     CBC -     Comprehensive metabolic panel with GFR -     Lipid panel -     PSA -     TSH -     Hemoglobin A1c -     Microalbumin/Creatinine Ratio, Urine -     Urinalysis, Routine w reflex microscopic  Type 2 diabetes mellitus with hyperglycemia, without Thurgood-term current use of insulin (HCC) -     Hemoglobin A1c -     Microalbumin/Creatinine Ratio, Urine -     VAS US  ABI WITH/WO TBI; Future -     Ambulatory referral to Podiatry  COVID-19 vaccine administered -     Pfizer Comirnaty Covid-19 Vaccine 70yrs & older  Needs flu shot -     Flu vaccine trivalent PF, 6mos and older(Flulaval,Afluria,Fluarix,Fluzone)  Essential hypertension, benign -     Comprehensive metabolic panel with GFR  Generalized anxiety disorder  Morbid obesity (HCC)  Encounter for lipid screening for cardiovascular disease -     Lipid panel  Vaccine counseling  Screening for colon cancer -     Ambulatory referral to Gastroenterology  Screening for prostate cancer -     PSA  Screening for hematuria or proteinuria -     Urinalysis, Routine w reflex microscopic  Decreased pedal pulses -     VAS US  ABI WITH/WO TBI; Future  Hypertrophic toenail -     Ambulatory referral to Podiatry     I recommend follow up yearly for a routine physical.   [facility]

## 2024-03-13 LAB — CBC
Hematocrit: 48.9 % (ref 37.5–51.0)
Hemoglobin: 16.2 g/dL (ref 13.0–17.7)
MCH: 28.9 pg (ref 26.6–33.0)
MCHC: 33.1 g/dL (ref 31.5–35.7)
MCV: 87 fL (ref 79–97)
Platelets: 319 x10E3/uL (ref 150–450)
RBC: 5.6 x10E6/uL (ref 4.14–5.80)
RDW: 13.6 % (ref 11.6–15.4)
WBC: 10 x10E3/uL (ref 3.4–10.8)

## 2024-03-13 LAB — COMPREHENSIVE METABOLIC PANEL WITH GFR
ALT: 24 IU/L (ref 0–44)
AST: 19 IU/L (ref 0–40)
Albumin: 4.1 g/dL (ref 3.9–4.9)
Alkaline Phosphatase: 66 IU/L (ref 47–123)
BUN/Creatinine Ratio: 13 (ref 10–24)
BUN: 17 mg/dL (ref 8–27)
Bilirubin Total: 0.7 mg/dL (ref 0.0–1.2)
CO2: 22 mmol/L (ref 20–29)
Calcium: 9.6 mg/dL (ref 8.6–10.2)
Chloride: 101 mmol/L (ref 96–106)
Creatinine, Ser: 1.26 mg/dL (ref 0.76–1.27)
Globulin, Total: 3.9 g/dL (ref 1.5–4.5)
Glucose: 90 mg/dL (ref 70–99)
Potassium: 4.5 mmol/L (ref 3.5–5.2)
Sodium: 138 mmol/L (ref 134–144)
Total Protein: 8 g/dL (ref 6.0–8.5)
eGFR: 64 mL/min/1.73 (ref >59)

## 2024-03-13 LAB — URINALYSIS, ROUTINE W REFLEX MICROSCOPIC
Bilirubin, UA: NEGATIVE
Glucose, UA: NEGATIVE
Ketones, UA: NEGATIVE
Leukocytes,UA: NEGATIVE
Nitrite, UA: NEGATIVE
Protein,UA: NEGATIVE
RBC, UA: NEGATIVE
Specific Gravity, UA: 1.018 (ref 1.005–1.030)
Urobilinogen, Ur: 0.2 mg/dL (ref 0.2–1.0)
pH, UA: 6.5 (ref 5.0–7.5)

## 2024-03-13 LAB — LIPID PANEL
Chol/HDL Ratio: 5.5 ratio — ABNORMAL HIGH (ref 0.0–5.0)
Cholesterol, Total: 160 mg/dL (ref 100–199)
HDL: 29 mg/dL — ABNORMAL LOW (ref >39)
LDL Chol Calc (NIH): 114 mg/dL — ABNORMAL HIGH (ref 0–99)
Triglycerides: 89 mg/dL (ref 0–149)
VLDL Cholesterol Cal: 17 mg/dL (ref 5–40)

## 2024-03-13 LAB — MICROALBUMIN / CREATININE URINE RATIO
Creatinine, Urine: 182.9 mg/dL
Microalb/Creat Ratio: 8 mg/g{creat} (ref 0–29)
Microalbumin, Urine: 14.3 ug/mL

## 2024-03-13 LAB — HEMOGLOBIN A1C
Est. average glucose Bld gHb Est-mCnc: 117 mg/dL
Hgb A1c MFr Bld: 5.7 % — ABNORMAL HIGH (ref 4.8–5.6)

## 2024-03-13 LAB — TSH: TSH: 2.13 u[IU]/mL (ref 0.450–4.500)

## 2024-03-13 LAB — PSA: Prostate Specific Ag, Serum: 20.6 ng/mL — ABNORMAL HIGH (ref 0.0–4.0)

## 2024-03-15 ENCOUNTER — Ambulatory Visit: Payer: Self-pay | Admitting: Medical

## 2024-03-15 ENCOUNTER — Other Ambulatory Visit: Payer: Self-pay | Admitting: Medical

## 2024-03-15 DIAGNOSIS — R972 Elevated prostate specific antigen [PSA]: Secondary | ICD-10-CM

## 2024-03-15 MED ORDER — ROSUVASTATIN CALCIUM 10 MG PO TABS
10.0000 mg | ORAL_TABLET | Freq: Every day | ORAL | 3 refills | Status: AC
Start: 2024-03-15 — End: 2025-03-15

## 2024-03-15 MED ORDER — MOUNJARO 15 MG/0.5ML ~~LOC~~ SOAJ: 15.0000 mg | SUBCUTANEOUS | 5 refills | Status: AC

## 2024-03-15 NOTE — Progress Notes (Signed)
 Results thru my chart

## 2024-03-20 ENCOUNTER — Ambulatory Visit (HOSPITAL_COMMUNITY)
Admission: RE | Admit: 2024-03-20 | Discharge: 2024-03-20 | Disposition: A | Payer: Self-pay | Source: Ambulatory Visit | Attending: Medical | Admitting: Medical

## 2024-03-20 DIAGNOSIS — R0989 Other specified symptoms and signs involving the circulatory and respiratory systems: Secondary | ICD-10-CM | POA: Diagnosis present

## 2024-03-20 DIAGNOSIS — E1165 Type 2 diabetes mellitus with hyperglycemia: Secondary | ICD-10-CM | POA: Insufficient documentation

## 2024-03-20 LAB — VAS US ABI WITH/WO TBI
Left ABI: 1.19
Right ABI: 1.1

## 2024-03-20 NOTE — Progress Notes (Signed)
 Results to MyChart

## 2024-03-25 ENCOUNTER — Ambulatory Visit: Admitting: Podiatry

## 2024-03-25 ENCOUNTER — Encounter: Payer: Self-pay | Admitting: Podiatry

## 2024-03-25 VITALS — Ht 74.0 in | Wt 299.8 lb

## 2024-03-25 DIAGNOSIS — B351 Tinea unguium: Secondary | ICD-10-CM | POA: Diagnosis not present

## 2024-03-25 DIAGNOSIS — M79675 Pain in left toe(s): Secondary | ICD-10-CM

## 2024-03-25 DIAGNOSIS — M79674 Pain in right toe(s): Secondary | ICD-10-CM

## 2024-03-25 NOTE — Progress Notes (Signed)
 Chief Complaint  Patient presents with   Nail Problem    Pt is here for [facility].    SUBJECTIVE Patient with a history of diabetes mellitus presents to office today complaining of elongated, thickened nails that cause pain while ambulating in shoes.  Patient is unable to trim their own nails. Patient is here for further evaluation and treatment.  Past Medical History:  Diagnosis Date   Allergy    rhinitis   Anxiety    Depression    Dislocation of left elbow 06/27/2015   GERD (gastroesophageal reflux disease)    History of kidney stones    Hypertension    Major depression 2013   Phillip Ortega   PONV (postoperative nausea and vomiting)    Nausea   Sleep apnea 2013   Phillip Ortega   Stroke [facility]) 06/2015   TIA (transient ischemic attack)     No Known Allergies   OBJECTIVE General Patient is awake, alert, and oriented x 3 and in no acute distress. Derm Skin is dry and supple bilateral. Negative open lesions or macerations. Remaining integument unremarkable. Nails are tender, Boedecker, thickened and dystrophic with subungual debris, consistent with onychomycosis, 1-5 bilateral. No signs of infection noted. Vasc   VAS US  ABI WITH/WO TBI (Accession [id number]) (Order [id number]) Vascular Ultrasound Date: 03/20/2024 Department: [facility] Ultrasound at [facility] Dept. of [facility] Released By: Phillip Ortega Authorizing: Phillip Alm RAMAN, PA-C  ABI Findings:  +---------+------------------+-----+-----------+--------+  Right   Rt Pressure (mmHg)IndexWaveform   Comment   +---------+------------------+-----+-----------+--------+  Brachial 119                                         +---------+------------------+-----+-----------+--------+  ATA     119               1.00 multiphasic          +---------+------------------+-----+-----------+--------+  PTA     131               1.10 multiphasic           +---------+------------------+-----+-----------+--------+  Great Toe76                0.64                      +---------+------------------+-----+-----------+--------+   +---------+------------------+-----+-----------+-------+  Left    Lt Pressure (mmHg)IndexWaveform   Comment  +---------+------------------+-----+-----------+-------+  Brachial 114                                        +---------+------------------+-----+-----------+-------+  ATA     123               1.03 multiphasic         +---------+------------------+-----+-----------+-------+  PTA     142               1.19 multiphasic         +---------+------------------+-----+-----------+-------+  Great Toe99                0.83                     +---------+------------------+-----+-----------+-------+   +-------+-----------+-----------+------------+------------+  ABI/TBIToday's ABIToday's TBIPrevious ABIPrevious TBI  +-------+-----------+-----------+------------+------------+  Right 1.1  0.64                                 +-------+-----------+-----------+------------+------------+  Left  1.19       0.83                                 +-------+-----------+-----------+------------+------------+  No previous ABI.    Summary:  Right: Resting right ankle-brachial index is within normal range. The right toe-brachial index is abnormal. Right toe pressure is >60 mmHg which suggests adequate perfusion for healing.    Left: Resting left ankle-brachial index is within normal range. The left toe-brachial index is normal.  Neuro light touch and protective threshold sensation diminished bilaterally.  Musculoskeletal Exam No symptomatic pedal deformities noted bilateral. Muscular strength within normal limits.  ASSESSMENT 1. Diabetes Mellitus w/ peripheral neuropathy 2.  Pain due to onychomycosis of toenails bilateral  PLAN OF CARE 1. Patient evaluated today. 2.  Instructed to maintain good pedal hygiene and foot care. Stressed importance of controlling blood sugar.  3. Mechanical debridement of nails 1-5 bilaterally performed using a nail nipper. Filed with dremel without incident.  4. Return to clinic PRN    Thresa EMERSON Sar, DPM [facility]  Dr. Thresa EMERSON Sar, DPM    [address] [place], [place] [address]                Office [phone]  Fax [phone]

## 2024-03-29 NOTE — Progress Notes (Signed)
    Chief Complaint: Abnormal PSA test  History of Present Illness:  Phillip Ortega is a 63 y.o. male who is seen in consultation from Phillip Ortega for evaluation of elevated PSA. Recent PSA data: 10.9.2025--20.6  No prior PSA data is available prior to 3 weeks ago.  He states that when he was a younger man he was treated for prostate issues.  He was told he had these, but he never saw urologist.  Symptoms sound more related to lower GI than prostate, however.  Currently urinary symptoms include   Past Medical History:  Past Medical History:  Diagnosis Date   Allergy    rhinitis   Anxiety    Depression    Dislocation of left elbow 06/27/2015   GERD (gastroesophageal reflux disease)    History of kidney stones    Hypertension    Major depression 2013   [name]   PONV (postoperative nausea and vomiting)    Nausea   Sleep apnea 2013   [name]   Stroke [facility]) 06/2015   TIA (transient ischemic attack)     Past Surgical History:  Past Surgical History:  Procedure Laterality Date   BONE EXCISION Left 01/20/2016   Procedure: Radical EXCISION HETEROTOPIC OSSIFICATION LEFT ELBOW;  Surgeon: [name], MD;  Location: [facility] OR;  Service: Orthopedics;  Laterality: Left;   LAPAROSCOPIC CHOLECYSTECTOMY  07   [place]   ORIF ELBOW FRACTURE Left 06/25/2015   Procedure: OPEN REDUCTION INTERNAL FIXATION (ORIF) ELBOW/OLECRANON FRACTURE RADIO HEAD ARTHROPLASTY;  Surgeon: [name], MD;  Location: [facility] OR;  Service: Orthopedics;  Laterality: Left;   TEE WITHOUT CARDIOVERSION N/A 11/29/2015   Procedure: TRANSESOPHAGEAL ECHOCARDIOGRAM (TEE);  Surgeon: [name], MD;  Location: [facility] ENDOSCOPY;  Service: Cardiovascular;  Laterality: N/A;    Allergies:  No Known Allergies  Family History:  Family History  Problem Relation Age of Onset   Migraines Mother    Dementia Mother    Anemia Sister     Social History:  Social History   Tobacco Use   Smoking status: Never    Smokeless tobacco: Never  Substance Use Topics   Alcohol use: Yes    Alcohol/week: 0.0 standard drinks of alcohol    Comment: social   Drug use: No  Review of systems:  Except for those found in HPI, negative  Physical exam: Relevant exam:  Abdomen obese, no inguinal hernias.  No inguinal adenopathy GU: Phallus uncircumcised.  No lesions.  Meatus normal.  Scrotal skin normal.  Testicles, cords and epididymal structures normal bilaterally.  Normal anal sphincter tone.  Gland 30 g, symmetric, nonnodular, nontender.    Results:  I have reviewed referring/prior physicians notes-[facility] reviewed  I have reviewed urinalysis-clear today  I have reviewed PSA results  I have reviewed prior imaging--images from 2017 stone protocol CT reviewed.  Estimated prostate volume 25 mL  IPSS sheet reviewed--12/3  Assessment: -Elevated PSA.  Benign exam today, but PSA significantly elevated at 20.6.  I have no prior PSA data.  -History of prostate problems.  Currently no prostate tenderness, empties well.  Minimal symptomatology   Plan: -I put him on 3 weeks of low-dose Keflex  -I will have him come back in 1 month to recheck PSA.  If no change consider prostate MRI with eventual biopsy  -If PSA returns normal, resume screening per PCP

## 2024-03-30 ENCOUNTER — Ambulatory Visit: Admitting: Urology

## 2024-03-30 ENCOUNTER — Encounter: Payer: Self-pay | Admitting: Urology

## 2024-03-30 VITALS — BP 130/84 | HR 79 | Ht 74.0 in | Wt 299.0 lb

## 2024-03-30 DIAGNOSIS — R972 Elevated prostate specific antigen [PSA]: Secondary | ICD-10-CM | POA: Diagnosis not present

## 2024-03-30 LAB — MICROSCOPIC EXAMINATION

## 2024-03-30 LAB — URINALYSIS, ROUTINE W REFLEX MICROSCOPIC
Glucose, UA: NEGATIVE
Ketones, UA: NEGATIVE
Leukocytes,UA: NEGATIVE
Nitrite, UA: NEGATIVE
RBC, UA: NEGATIVE
Specific Gravity, UA: 1.02 (ref 1.005–1.030)
Urobilinogen, Ur: 1 mg/dL (ref 0.2–1.0)
pH, UA: 6 (ref 5.0–7.5)

## 2024-03-30 LAB — BLADDER SCAN AMB NON-IMAGING: Scan Result: 13

## 2024-03-30 MED ORDER — CEPHALEXIN 500 MG PO CAPS: 500.0000 mg | ORAL_CAPSULE | Freq: Every day | ORAL | 0 refills | Status: AC

## 2024-05-30 ENCOUNTER — Other Ambulatory Visit: Payer: Self-pay | Admitting: Medical

## 2024-06-12 LAB — HM COLONOSCOPY

## 2025-03-16 ENCOUNTER — Encounter: Payer: Self-pay | Admitting: Medical
